# Patient Record
Sex: Male | Born: 1975
Health system: Southern US, Community
[De-identification: ages and names within clinical notes are randomized; demographics above are authoritative.]

## PROBLEM LIST (undated history)

## (undated) DIAGNOSIS — F329 Major depressive disorder, single episode, unspecified: Secondary | ICD-10-CM

## (undated) DIAGNOSIS — J309 Allergic rhinitis, unspecified: Secondary | ICD-10-CM

## (undated) DIAGNOSIS — T7840XA Allergy, unspecified, initial encounter: Secondary | ICD-10-CM

## (undated) DIAGNOSIS — B019 Varicella without complication: Secondary | ICD-10-CM

## (undated) DIAGNOSIS — F419 Anxiety disorder, unspecified: Secondary | ICD-10-CM

## (undated) DIAGNOSIS — F32A Depression, unspecified: Secondary | ICD-10-CM

## (undated) HISTORY — DX: Major depressive disorder, single episode, unspecified: F32.9

## (undated) HISTORY — PX: DENTAL SURGERY: SHX609

## (undated) HISTORY — DX: Varicella without complication: B01.9

## (undated) HISTORY — DX: Depression, unspecified: F32.A

## (undated) HISTORY — DX: Allergic rhinitis, unspecified: J30.9

## (undated) HISTORY — DX: Anxiety disorder, unspecified: F41.9

## (undated) HISTORY — DX: Allergy, unspecified, initial encounter: T78.40XA

---

## 2012-03-25 ENCOUNTER — Other Ambulatory Visit: Payer: Self-pay | Admitting: Family Medicine

## 2012-03-25 ENCOUNTER — Ambulatory Visit
Admission: RE | Admit: 2012-03-25 | Discharge: 2012-03-25 | Disposition: A | Discharge status: Home / Self Care | Payer: Private Health Insurance - Indemnity | Source: Ambulatory Visit | Attending: Family Medicine | Admitting: Family Medicine

## 2012-03-25 DIAGNOSIS — R300 Dysuria: Secondary | ICD-10-CM

## 2012-03-25 DIAGNOSIS — R109 Unspecified abdominal pain: Secondary | ICD-10-CM

## 2012-12-29 ENCOUNTER — Other Ambulatory Visit: Payer: Self-pay | Admitting: Allergy and Immunology

## 2012-12-29 ENCOUNTER — Ambulatory Visit
Admission: RE | Admit: 2012-12-29 | Discharge: 2012-12-29 | Disposition: A | Discharge status: Home / Self Care | Payer: Private Health Insurance - Indemnity | Source: Ambulatory Visit | Attending: Allergy and Immunology | Admitting: Allergy and Immunology

## 2012-12-29 DIAGNOSIS — R062 Wheezing: Secondary | ICD-10-CM

## 2015-09-19 ENCOUNTER — Encounter: Payer: Self-pay | Admitting: Adult Health

## 2015-09-19 ENCOUNTER — Ambulatory Visit (INDEPENDENT_AMBULATORY_CARE_PROVIDER_SITE_OTHER): Payer: Managed Care, Other (non HMO) | Admitting: Adult Health

## 2015-09-19 VITALS — BP 120/80 | HR 74 | Temp 98.3°F | Ht 74.5 in | Wt 234.0 lb

## 2015-09-19 DIAGNOSIS — Z0000 Encounter for general adult medical examination without abnormal findings: Secondary | ICD-10-CM | POA: Diagnosis not present

## 2015-09-19 DIAGNOSIS — Z23 Encounter for immunization: Secondary | ICD-10-CM

## 2015-09-19 LAB — CBC WITH DIFFERENTIAL/PLATELET
Basophils Absolute: 0 10*3/uL (ref 0.0–0.1)
Basophils Relative: 0.6 % (ref 0.0–3.0)
EOS PCT: 4.3 % (ref 0.0–5.0)
Eosinophils Absolute: 0.3 10*3/uL (ref 0.0–0.7)
HEMATOCRIT: 43.8 % (ref 39.0–52.0)
HEMOGLOBIN: 14.8 g/dL (ref 13.0–17.0)
LYMPHS ABS: 2.2 10*3/uL (ref 0.7–4.0)
LYMPHS PCT: 31.5 % (ref 12.0–46.0)
MCHC: 33.6 g/dL (ref 30.0–36.0)
MCV: 93.3 fl (ref 78.0–100.0)
MONOS PCT: 8.6 % (ref 3.0–12.0)
Monocytes Absolute: 0.6 10*3/uL (ref 0.1–1.0)
NEUTROS PCT: 55 % (ref 43.0–77.0)
Neutro Abs: 3.8 10*3/uL (ref 1.4–7.7)
Platelets: 381 10*3/uL (ref 150.0–400.0)
RBC: 4.7 Mil/uL (ref 4.22–5.81)
RDW: 12.8 % (ref 11.5–15.5)
WBC: 7 10*3/uL (ref 4.0–10.5)

## 2015-09-19 LAB — HEPATIC FUNCTION PANEL
ALBUMIN: 4.4 g/dL (ref 3.5–5.2)
ALK PHOS: 58 U/L (ref 39–117)
ALT: 39 U/L (ref 0–53)
AST: 23 U/L (ref 0–37)
Bilirubin, Direct: 0.1 mg/dL (ref 0.0–0.3)
TOTAL PROTEIN: 6.7 g/dL (ref 6.0–8.3)
Total Bilirubin: 0.6 mg/dL (ref 0.2–1.2)

## 2015-09-19 LAB — BASIC METABOLIC PANEL
BUN: 17 mg/dL (ref 6–23)
CHLORIDE: 105 meq/L (ref 96–112)
CO2: 30 meq/L (ref 19–32)
Calcium: 9.3 mg/dL (ref 8.4–10.5)
Creatinine, Ser: 1.25 mg/dL (ref 0.40–1.50)
GFR: 69.04 mL/min (ref >60.00)
GLUCOSE: 85 mg/dL (ref 70–99)
POTASSIUM: 3.9 meq/L (ref 3.5–5.1)
SODIUM: 142 meq/L (ref 135–145)

## 2015-09-19 LAB — LIPID PANEL
CHOL/HDL RATIO: 3
Cholesterol: 165 mg/dL (ref 0–200)
HDL: 47.8 mg/dL (ref >39.00)
LDL Cholesterol: 103 mg/dL — ABNORMAL HIGH (ref 0–99)
NONHDL: 116.77
Triglycerides: 71 mg/dL (ref 0.0–149.0)
VLDL: 14.2 mg/dL (ref 0.0–40.0)

## 2015-09-19 LAB — POCT URINALYSIS DIPSTICK
Bilirubin, UA: NEGATIVE
Glucose, UA: NEGATIVE
Ketones, UA: NEGATIVE
LEUKOCYTES UA: NEGATIVE
NITRITE UA: NEGATIVE
PH UA: 5.5
RBC UA: NEGATIVE
Spec Grav, UA: >=1.03
UROBILINOGEN UA: 0.2

## 2015-09-19 LAB — HEMOGLOBIN A1C: Hgb A1c MFr Bld: 5.1 % (ref 4.6–6.5)

## 2015-09-19 LAB — TSH: TSH: 2.12 u[IU]/mL (ref 0.35–4.50)

## 2015-09-19 NOTE — Patient Instructions (Addendum)
It was great meeting you today!  As discussed try OTC omeprazole for the upset stomach. If that does not work, please let me know.   I will follow up with you regarding labs.   You can follow up with me in one year or sooner if needed.   Health Maintenance, Male A healthy lifestyle and preventative care can promote health and wellness.  Maintain regular health, dental, and eye exams.  Eat a healthy diet. Foods like vegetables, fruits, whole grains, low-fat dairy products, and lean protein foods contain the nutrients you need and are low in calories. Decrease your intake of foods high in solid fats, added sugars, and salt. Get information about a proper diet from your health care provider, if necessary.  Regular physical exercise is one of the most important things you can do for your health. Most adults should get at least 150 minutes of moderate-intensity exercise (any activity that increases your heart rate and causes you to sweat) each week. In addition, most adults need muscle-strengthening exercises on 2 or more days a week.   Maintain a healthy weight. The body mass index (BMI) is a screening tool to identify possible weight problems. It provides an estimate of body fat based on height and weight. Your health care provider can find your BMI and can help you achieve or maintain a healthy weight. For males 20 years and older:  A BMI below 18.5 is considered underweight.  A BMI of 18.5 to 24.9 is normal.  A BMI of 25 to 29.9 is considered overweight.  A BMI of 30 and above is considered obese.  Maintain normal blood lipids and cholesterol by exercising and minimizing your intake of saturated fat. Eat a balanced diet with plenty of fruits and vegetables. Blood tests for lipids and cholesterol should begin at age 37 and be repeated every 5 years. If your lipid or cholesterol levels are high, you are over age 37, or you are at high risk for heart disease, you may need your cholesterol  levels checked more frequently.Ongoing high lipid and cholesterol levels should be treated with medicines if diet and exercise are not working.  If you smoke, find out from your health care provider how to quit. If you do not use tobacco, do not start.  Lung cancer screening is recommended for adults aged 55-80 years who are at high risk for developing lung cancer because of a history of smoking. A yearly low-dose CT scan of the lungs is recommended for people who have at least a 30-pack-year history of smoking and are current smokers or have quit within the past 15 years. A pack year of smoking is smoking an average of 1 pack of cigarettes a day for 1 year (for example, a 30-pack-year history of smoking could mean smoking 1 pack a day for 30 years or 2 packs a day for 15 years). Yearly screening should continue until the smoker has stopped smoking for at least 15 years. Yearly screening should be stopped for people who develop a health problem that would prevent them from having lung cancer treatment.  If you choose to drink alcohol, do not have more than 2 drinks per day. One drink is considered to be 12 oz (360 mL) of beer, 5 oz (150 mL) of wine, or 1.5 oz (45 mL) of liquor.  Avoid the use of street drugs. Do not share needles with anyone. Ask for help if you need support or instructions about stopping the use of drugs.  High blood pressure causes heart disease and increases the risk of stroke. High blood pressure is more likely to develop in:  People who have blood pressure in the end of the normal range (100-139/85-89 mm Hg).  People who are overweight or obese.  People who are African American.  If you are 58-22 years of age, have your blood pressure checked every 3-5 years. If you are 75 years of age or older, have your blood pressure checked every year. You should have your blood pressure measured twice--once when you are at a hospital or clinic, and once when you are not at a hospital or  clinic. Record the average of the two measurements. To check your blood pressure when you are not at a hospital or clinic, you can use:  An automated blood pressure machine at a pharmacy.  A home blood pressure monitor.  If you are 53-45 years old, ask your health care provider if you should take aspirin to prevent heart disease.  Diabetes screening involves taking a blood sample to check your fasting blood sugar level. This should be done once every 3 years after age 56 if you are at a normal weight and without risk factors for diabetes. Testing should be considered at a younger age or be carried out more frequently if you are overweight and have at least 1 risk factor for diabetes.  Colorectal cancer can be detected and often prevented. Most routine colorectal cancer screening begins at the age of 42 and continues through age 64. However, your health care provider may recommend screening at an earlier age if you have risk factors for colon cancer. On a yearly basis, your health care provider may provide home test kits to check for hidden blood in the stool. A small camera at the end of a tube may be used to directly examine the colon (sigmoidoscopy or colonoscopy) to detect the earliest forms of colorectal cancer. Talk to your health care provider about this at age 54 when routine screening begins. A direct exam of the colon should be repeated every 5-10 years through age 58, unless early forms of precancerous polyps or small growths are found.  People who are at an increased risk for hepatitis B should be screened for this virus. You are considered at high risk for hepatitis B if:  You were born in a country where hepatitis B occurs often. Talk with your health care provider about which countries are considered high risk.  Your parents were born in a high-risk country and you have not received a shot to protect against hepatitis B (hepatitis B vaccine).  You have HIV or AIDS.  You use needles  to inject street drugs.  You live with, or have sex with, someone who has hepatitis B.  You are a man who has sex with other men (MSM).  You get hemodialysis treatment.  You take certain medicines for conditions like cancer, organ transplantation, and autoimmune conditions.  Hepatitis C blood testing is recommended for all people born from [birth year] through 1963 and any individual with known risk factors for hepatitis C.  Healthy men should no longer receive prostate-specific antigen (PSA) blood tests as part of routine cancer screening. Talk to your health care provider about prostate cancer screening.  Testicular cancer screening is not recommended for adolescents or adult males who have no symptoms. Screening includes self-exam, a health care provider exam, and other screening tests. Consult with your health care provider about any symptoms you have or any  concerns you have about testicular cancer.  Practice safe sex. Use condoms and avoid high-risk sexual practices to reduce the spread of sexually transmitted infections (STIs).  You should be screened for STIs, including gonorrhea and chlamydia if:  You are sexually active and are younger than 24 years.  You are older than 24 years, and your health care provider tells you that you are at risk for this type of infection.  Your sexual activity has changed since you were last screened, and you are at an increased risk for chlamydia or gonorrhea. Ask your health care provider if you are at risk.  If you are at risk of being infected with HIV, it is recommended that you take a prescription medicine daily to prevent HIV infection. This is called pre-exposure prophylaxis (PrEP). You are considered at risk if:  You are a man who has sex with other men (MSM).  You are a heterosexual man who is sexually active with multiple partners.  You take drugs by injection.  You are sexually active with a partner who has HIV.  Talk with your health  care provider about whether you are at high risk of being infected with HIV. If you choose to begin PrEP, you should first be tested for HIV. You should then be tested every 3 months for as long as you are taking PrEP.  Use sunscreen. Apply sunscreen liberally and repeatedly throughout the day. You should seek shade when your shadow is shorter than you. Protect yourself by wearing long sleeves, pants, a wide-brimmed hat, and sunglasses year round whenever you are outdoors.  Tell your health care provider of new moles or changes in moles, especially if there is a change in shape or color. Also, tell your health care provider if a mole is larger than the size of a pencil eraser.  A one-time screening for abdominal aortic aneurysm (AAA) and surgical repair of large AAAs by ultrasound is recommended for men aged 41-75 years who are current or former smokers.  Stay current with your vaccines (immunizations).   This information is not intended to replace advice given to you by your health care provider. Make sure you discuss any questions you have with your health care provider.   Document Released: 03/27/2008 Document Revised: 10/20/2014 Document Reviewed: 02/24/2011 Elsevier Interactive Patient Education [employer].

## 2015-09-19 NOTE — Progress Notes (Signed)
HPI:  Aaron [name] is here to establish care and for his complete physical. He is a healthy caucasian male who  has a past medical history of Chicken pox; Depression; and Allergy.   Last PCP and physical: Three years ago  Immunizations: UTD  Diet: Twice a day, tries to eat healthy Exercise: walks at work, does not exercise outside of work.  Is followed by [facility] for depression.    Has the following chronic problems that require follow up and concerns today:  Depression  - Controlled on current medication. He sees his therapist twice a month   ROS negative for unless reported above: fevers, chills,feeling poorly, unintentional weight loss, hearing or vision loss, chest pain, palpitations, leg claudication, struggling to breath,Not feeling congested in the chest, no orthopenia, no cough,no wheezing, normal appetite, no soft tissue swelling, no hemoptysis, melena, hematochezia, hematuria, falls, loc, si, or thoughts of self harm.    Past Medical History  Diagnosis Date  . Chicken pox   . Depression   . Allergy     History reviewed. No pertinent past surgical history.  Family History  Problem Relation Age of Onset  . Heart disease Father     Social History   Social History  . Marital Status: Single    Spouse Name: N/A  . Number of Children: N/A  . Years of Education: N/A   Social History Main Topics  . Smoking status: Never Smoker   . Smokeless tobacco: None  . Alcohol Use: Yes  . Drug Use: None  . Sexual Activity: Not Asked   Other Topics Concern  . None   Social History Narrative  . None     Current outpatient prescriptions:  .  buPROPion (WELLBUTRIN XL) 150 MG 24 hr tablet, Take 150 mg by mouth daily., Disp: , Rfl: 0 .  clonazePAM (KLONOPIN) 0.5 MG tablet, Take 0.5 mg by mouth daily., Disp: , Rfl: 0 .  DULoxetine (CYMBALTA) 60 MG capsule, Take 60 mg by mouth daily., Disp: , Rfl: 0 .  REXULTI 1 MG TABS, Take 1 mg by mouth daily.,  Disp: , Rfl:   EXAM:  Filed Vitals:   09/19/15 1308  BP: 120/80  Pulse: 74  Temp: 98.3 F (36.8 C)    Body mass index is 29.65 kg/(m^2).  GENERAL: vitals reviewed and listed above, alert, oriented, appears well hydrated and in no acute distress  HEENT: atraumatic, conjunttiva clear, no obvious abnormalities on inspection of external nose and ears  NECK: Neck is soft and supple without masses, no adenopathy or thyromegaly, trachea midline, no JVD. Normal range of motion.   LUNGS: clear to auscultation bilaterally, no wheezes, rales or rhonchi, good air movement  CV: Regular rate and rhythm, normal S1/S2, no audible murmurs, gallops, or rubs. No carotid bruit and no peripheral edema.   MS: moves all extremities without noticeable abnormality. No edema noted  Abd: soft/nontender/nondistended/normal bowel sounds   Skin: warm and dry, no rash   Extremities: No clubbing, cyanosis, or edema. Capillary refill is WNL. Pulses intact bilaterally in upper and lower extremities.   Neuro: CN II-XII intact, sensation and reflexes normal throughout, 5/5 muscle strength in bilateral upper and lower extremities. Normal finger to nose. Normal rapid alternating movements. Normal romberg. No pronator drift.   PSYCH: pleasant and cooperative, no obvious depression or anxiety  ASSESSMENT AND PLAN:  1. Routine general medical examination at a health care facility - Basic metabolic panel - CBC with Differential/Platelet - Hemoglobin A1c -  Hepatic function panel - Lipid panel - POCT urinalysis dipstick - TSH - HIV antibody - HSV(herpes smplx)abs-1+2(IgG+IgM)-bld - RPR - Acute Hep Panel & Hep B Surface Ab - Work on diet and exercise.  - Follow up in one year or sooner if needed.   Discussed the following assessment and plan:  -We reviewed the PMH, PSH, FH, SH, Meds and Allergies. -We provided refills for any medications we will prescribe as needed. -We addressed current concerns per  orders and patient instructions. -We have asked for records for pertinent exams, studies, vaccines and notes from previous providers. -We have advised patient to follow up per instructions below.   -Patient advised to return or notify a provider immediately if symptoms worsen or persist or new concerns arise.    Shirline Freesory Susana Duell , AGNP

## 2015-09-19 NOTE — Progress Notes (Signed)
Pre visit review using our clinic review tool, if applicable. No additional management support is needed unless otherwise documented below in the visit note. 

## 2015-09-19 NOTE — Addendum Note (Signed)
Addended by: Kern ReapVEREEN, Herma Uballe B on: 09/19/2015 01:44 PM   Modules accepted: Orders

## 2015-09-20 LAB — ACUTE HEP PANEL AND HEP B SURFACE AB
HCV AB: NEGATIVE
HEP A IGM: NONREACTIVE
HEP B S AB: NEGATIVE
HEP B S AG: NEGATIVE
Hep B C IgM: NONREACTIVE

## 2015-09-20 LAB — RPR

## 2015-09-20 LAB — HIV ANTIBODY (ROUTINE TESTING W REFLEX): HIV 1&2 Ab, 4th Generation: NONREACTIVE

## 2015-09-21 LAB — HSV(HERPES SMPLX)ABS-I+II(IGG+IGM)-BLD
HSV 1 Glycoprotein G Ab, IgG: 8.89 IV — ABNORMAL HIGH
HSV 2 Glycoprotein G Ab, IgG: <0.1 IV
Herpes Simplex Vrs I&II-IgM Ab (EIA): 0.83 INDEX

## 2015-09-27 ENCOUNTER — Encounter: Payer: Self-pay | Admitting: Adult Health

## 2015-10-10 ENCOUNTER — Ambulatory Visit: Payer: Private Health Insurance - Indemnity | Admitting: Adult Health

## 2016-10-16 ENCOUNTER — Ambulatory Visit (INDEPENDENT_AMBULATORY_CARE_PROVIDER_SITE_OTHER): Payer: Commercial Managed Care - PPO | Admitting: Adult Health

## 2016-10-16 ENCOUNTER — Encounter: Payer: Self-pay | Admitting: Adult Health

## 2016-10-16 ENCOUNTER — Encounter: Payer: Managed Care, Other (non HMO) | Admitting: Adult Health

## 2016-10-16 VITALS — BP 154/80 | Temp 98.6°F | Ht 74.5 in | Wt 235.9 lb

## 2016-10-16 DIAGNOSIS — M25562 Pain in left knee: Secondary | ICD-10-CM | POA: Diagnosis not present

## 2016-10-16 DIAGNOSIS — G8929 Other chronic pain: Secondary | ICD-10-CM

## 2016-10-16 DIAGNOSIS — Z0000 Encounter for general adult medical examination without abnormal findings: Secondary | ICD-10-CM

## 2016-10-16 DIAGNOSIS — M25561 Pain in right knee: Secondary | ICD-10-CM

## 2016-10-16 NOTE — Progress Notes (Addendum)
Subjective:    Patient ID: Aaron GrieveDamian Arrey, male    DOB: 1976-04-17, 41 y.o.   MRN: [id number]  HPI  Patient presents for yearly preventative medicine examination. He is a pleasant 41 year old male who  has a past medical history of Allergy; Chicken pox; and Depression.  All immunizations and health maintenance protocols were reviewed with the patient and needed orders were placed.  Appropriate screening laboratory values were ordered for the patient including screening of hyperlipidemia, renal function and hepatic function.  extrapyramidal reactio  Medication reconciliation,  past medical history, social history, problem list and allergies were reviewed in detail with the patient  Goals were established with regard to weight loss, exercise, and  diet in compliance with medications. He is not exercising but is eating healthy. He recently was switched from 3rd shift to 1st shift.   He denies any interval history but is happy to report that he welcomed a new baby boy into his family.   He continues to follow up with psychiatry bi monthly. His medications have been switched to Valium when needed and Saphris daily. He feels as though he is doing well on this medication but that there is an adjustment period to it. He does feel anxious from time to time.   He continues to have episodes of bilateral knee pain. This has been a chronic issue. Not currently doing any therapy.    Review of Systems  Constitutional: Negative.   HENT: Negative.   Eyes: Negative.   Respiratory: Negative.   Cardiovascular: Negative.   Gastrointestinal: Negative.   Endocrine: Negative.   Genitourinary: Negative.   Musculoskeletal: Positive for arthralgias and back pain. Negative for gait problem and myalgias.  Skin: Negative.   Allergic/Immunologic: Negative.   Neurological: Negative.   Hematological: Negative.   Psychiatric/Behavioral: Positive for sleep disturbance. The patient is nervous/anxious.   All  other systems reviewed and are negative.  Past Medical History:  Diagnosis Date  . Allergy   . Chicken pox   . Depression     Social History   Social History  . Marital status: Single    Spouse name: N/A  . Number of children: N/A  . Years of education: N/A   Occupational History  . Not on file.   Social History Main Topics  . Smoking status: Never Smoker  . Smokeless tobacco: Not on file  . Alcohol use 0.0 oz/week     Comment: occassional  . Drug use: No  . Sexual activity: Not on file   Other Topics Concern  . 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    Married    Two daughters, one daughter on the way    He likes to sleep     No past surgical history on file.  Family History  Problem Relation Age of Onset  . Heart disease Father   . Depression Maternal Grandmother     commited suicide    No Known Allergies  Current Outpatient Prescriptions on File Prior to Visit  Medication Sig Dispense Refill  . DULoxetine (CYMBALTA) 60 MG capsule Take 60 mg by mouth daily.  0   No current facility-administered medications on file prior to visit.     BP (!) 154/80   Temp 98.6 F (37 C) (Oral)   Ht 6' 2.5" (1.892 m)   Wt 235 lb 14.4 oz (107 kg)   BMI 29.88 kg/m  Objective:   Physical Exam  Constitutional: He is oriented to person, place, and time. Vital signs are normal. He appears well-developed and well-nourished. No distress.  HENT:  Head: Normocephalic and atraumatic.  Right Ear: External ear normal.  Left Ear: External ear normal.  Nose: Nose normal.  Mouth/Throat: Oropharynx is clear and moist. No oropharyngeal exudate.  Eyes: Conjunctivae and EOM are normal. Pupils are equal, round, and reactive to light. Right eye exhibits no discharge. Left eye exhibits no discharge. No scleral icterus.  Neck: Trachea normal and normal range of motion. Neck supple. No JVD present. No tracheal  tenderness present. Carotid bruit is not present. No tracheal deviation present. No thyroid mass and no thyromegaly present.  Cardiovascular: Normal rate, regular rhythm, normal heart sounds and intact distal pulses.  Exam reveals no gallop and no friction rub.   No murmur heard. Pulmonary/Chest: Effort normal and breath sounds normal. No stridor. No respiratory distress. He has no wheezes. He has no rales. He exhibits no tenderness.  Abdominal: Soft. Bowel sounds are normal. He exhibits no distension and no mass. There is no tenderness. There is no rebound and no guarding.  Genitourinary:  Genitourinary Comments: deferred  Musculoskeletal: Normal range of motion. He exhibits no edema, tenderness or deformity.  Lymphadenopathy:    He has no cervical adenopathy.  Neurological: He is alert and oriented to person, place, and time. He has normal reflexes. He displays normal reflexes. No cranial nerve deficit. He exhibits normal muscle tone. Coordination normal.  Skin: Skin is warm and dry. No rash noted. He is not diaphoretic. No erythema. No pallor.  Psychiatric: He has a normal mood and affect. His behavior is normal. Judgment and thought content normal.  Nursing note and vitals reviewed.     Assessment & Plan:  1. Routine general medical examination at a health care facility - Educated on the importance of diet and exercise.  - Basic metabolic panel; Future - CBC with Differential/Platelet; Future - Hepatic function panel; Future - Lipid panel; Future - POCT Urinalysis Dipstick (Automated); Future - TSH; Future - Follow up in one year or sooner if needed  2. Chronic pain of both knees - Likely arthritic pain. No concern for ligament or tendon tear.  - Nsaids as needed - Consider referral to PT in the future; if needed   Shirline Frees, NP

## 2016-10-22 ENCOUNTER — Encounter: Payer: Self-pay | Admitting: Adult Health

## 2016-10-24 ENCOUNTER — Encounter: Payer: Self-pay | Admitting: Adult Health

## 2016-10-24 ENCOUNTER — Ambulatory Visit (INDEPENDENT_AMBULATORY_CARE_PROVIDER_SITE_OTHER): Payer: Commercial Managed Care - PPO | Admitting: Adult Health

## 2016-10-24 VITALS — BP 148/78 | Temp 98.3°F

## 2016-10-24 DIAGNOSIS — K2100 Gastro-esophageal reflux disease with esophagitis, without bleeding: Secondary | ICD-10-CM

## 2016-10-24 NOTE — Progress Notes (Signed)
Subjective:    Patient ID: Aaron [name], male    DOB: 29-Dec-1975, 41 y.o.   MRN: [id number]  HPI  41 year old male who  has a past medical history of Allergy; Chicken pox; and Depression. He presents to the office today to discuss medical records dealing with arthritis in bilateral knees and lower back. She reports that he is going to the [facility] since he is a [facility] to be evaluated for this. He just needs notes from me to give to the [facility]  He also feels as though some of his psychiatry medicines might be causing acid reflux. He does have occasional burning and waking up with a sour taste in his mouth. He states that his psychiatrist diagnosed him with GERD and he is wondering what he can do about it.  Review of Systems  Constitutional: Negative.   Respiratory: Negative.   Cardiovascular: Negative.   Gastrointestinal: Positive for abdominal pain. Negative for abdominal distention, anal bleeding, blood in stool, constipation, diarrhea, nausea, rectal pain and vomiting.  Musculoskeletal: Positive for arthralgias and back pain.  Neurological: Negative.   All other systems reviewed and are negative.  Past Medical History:  Diagnosis Date  . Allergy   . Chicken pox   . Depression     Social History   Social History  . Marital status: Single    Spouse name: N/A  . Number of children: N/A  . Years of education: N/A   Occupational History  . Not on file.   Social History Main Topics  . Smoking status: Never Smoker  . Smokeless tobacco: Not on file  . Alcohol use 0.0 oz/week     Comment: occassional  . Drug use: No  . Sexual activity: Not on file   Other Topics Concern  . 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    Married    Two daughters, one daughter on the way    He likes to sleep     No past surgical history on file.  Family History  Problem Relation Age of Onset  . Heart disease Father   . Depression  Maternal Grandmother     commited suicide    No Known Allergies  Current Outpatient Prescriptions on File Prior to Visit  Medication Sig Dispense Refill  . Asenapine Maleate (SAPHRIS) 10 MG SUBL Place under the tongue.    . diazepam (VALIUM) 5 MG tablet Take 5 mg by mouth every 6 (six) hours as needed for anxiety.    . DULoxetine (CYMBALTA) 60 MG capsule Take 60 mg by mouth daily.  0   No current facility-administered medications on file prior to visit.     BP (!) 148/78   Temp 98.3 F (36.8 C) (Oral)       Objective:   Physical Exam  Constitutional: He is oriented to person, place, and time. He appears well-developed and well-nourished. No distress.  Cardiovascular: Normal rate, regular rhythm, normal heart sounds and intact distal pulses.  Exam reveals no gallop and no friction rub.   No murmur heard. Pulmonary/Chest: Effort normal and breath sounds normal. No respiratory distress. He has no wheezes. He has no rales. He exhibits no tenderness.  Abdominal: Soft. Bowel sounds are normal. He exhibits no distension and no mass. There is no tenderness. There is no rebound and no guarding.  Musculoskeletal: Normal range of motion. He exhibits no edema, tenderness or deformity.  Neurological: He is  alert and oriented to person, place, and time.  Skin: Skin is warm and dry. No rash noted. He is not diaphoretic. No erythema. No pallor.  Psychiatric: He has a normal mood and affect. His behavior is normal. Judgment and thought content normal.  Nursing note and vitals reviewed.     Assessment & Plan:  1. Gastroesophageal reflux disease with esophagitis - Advised to work on changing diet trying to avoid fried foods, fatty foods, acidic foods, spicy foods. - Can also try taking over-the-counter omeprazole 20 mg daily to see if this helps - Follow up if no improvement  * Records given to the patient dealing with his chronic arthritis pain  Shirline Frees, NP

## 2016-10-29 ENCOUNTER — Other Ambulatory Visit: Payer: Commercial Managed Care - PPO

## 2016-11-14 ENCOUNTER — Other Ambulatory Visit (INDEPENDENT_AMBULATORY_CARE_PROVIDER_SITE_OTHER): Payer: Commercial Managed Care - PPO

## 2016-11-14 DIAGNOSIS — Z0000 Encounter for general adult medical examination without abnormal findings: Secondary | ICD-10-CM | POA: Diagnosis not present

## 2016-11-14 LAB — BASIC METABOLIC PANEL
BUN: 13 mg/dL (ref 6–23)
CALCIUM: 9.8 mg/dL (ref 8.4–10.5)
CO2: 32 mEq/L (ref 19–32)
CREATININE: 1.13 mg/dL (ref 0.40–1.50)
Chloride: 102 mEq/L (ref 96–112)
GFR: 77.08 mL/min (ref >60.00)
GLUCOSE: 92 mg/dL (ref 70–99)
POTASSIUM: 4.1 meq/L (ref 3.5–5.1)
Sodium: 141 mEq/L (ref 135–145)

## 2016-11-14 LAB — HEPATIC FUNCTION PANEL
ALT: 33 U/L (ref 0–53)
AST: 25 U/L (ref 0–37)
Albumin: 4.7 g/dL (ref 3.5–5.2)
Alkaline Phosphatase: 69 U/L (ref 39–117)
BILIRUBIN DIRECT: 0.1 mg/dL (ref 0.0–0.3)
BILIRUBIN TOTAL: 0.4 mg/dL (ref 0.2–1.2)
Total Protein: 7.2 g/dL (ref 6.0–8.3)

## 2016-11-14 LAB — CBC WITH DIFFERENTIAL/PLATELET
BASOS ABS: 0 10*3/uL (ref 0.0–0.1)
Basophils Relative: 0.7 % (ref 0.0–3.0)
EOS ABS: 0.5 10*3/uL (ref 0.0–0.7)
EOS PCT: 6.9 % — AB (ref 0.0–5.0)
HCT: 42.5 % (ref 39.0–52.0)
HEMOGLOBIN: 14.9 g/dL (ref 13.0–17.0)
LYMPHS ABS: 1.3 10*3/uL (ref 0.7–4.0)
Lymphocytes Relative: 19.6 % (ref 12.0–46.0)
MCHC: 35.1 g/dL (ref 30.0–36.0)
MCV: 93.1 fl (ref 78.0–100.0)
MONO ABS: 0.6 10*3/uL (ref 0.1–1.0)
Monocytes Relative: 9 % (ref 3.0–12.0)
NEUTROS PCT: 63.8 % (ref 43.0–77.0)
Neutro Abs: 4.3 10*3/uL (ref 1.4–7.7)
Platelets: 352 10*3/uL (ref 150.0–400.0)
RBC: 4.56 Mil/uL (ref 4.22–5.81)
RDW: 13.2 % (ref 11.5–15.5)
WBC: 6.7 10*3/uL (ref 4.0–10.5)

## 2016-11-14 LAB — LIPID PANEL
CHOLESTEROL: 182 mg/dL (ref 0–200)
HDL: 69.3 mg/dL (ref >39.00)
LDL Cholesterol: 96 mg/dL (ref 0–99)
NonHDL: 112.91
Total CHOL/HDL Ratio: 3
Triglycerides: 86 mg/dL (ref 0.0–149.0)
VLDL: 17.2 mg/dL (ref 0.0–40.0)

## 2016-11-14 LAB — TSH: TSH: 1.33 u[IU]/mL (ref 0.35–4.50)

## 2016-12-10 ENCOUNTER — Ambulatory Visit (INDEPENDENT_AMBULATORY_CARE_PROVIDER_SITE_OTHER): Payer: Commercial Managed Care - PPO | Admitting: Adult Health

## 2016-12-10 ENCOUNTER — Ambulatory Visit (INDEPENDENT_AMBULATORY_CARE_PROVIDER_SITE_OTHER)
Admission: RE | Admit: 2016-12-10 | Discharge: 2016-12-10 | Disposition: A | Discharge status: Home / Self Care | Payer: Commercial Managed Care - PPO | Source: Ambulatory Visit | Attending: Adult Health | Admitting: Adult Health

## 2016-12-10 VITALS — BP 144/80 | Temp 98.2°F | Ht 74.5 in | Wt 244.8 lb

## 2016-12-10 DIAGNOSIS — S3992XA Unspecified injury of lower back, initial encounter: Secondary | ICD-10-CM

## 2016-12-10 MED ORDER — METHYLPREDNISOLONE ACETATE 80 MG/ML IJ SUSP
80.0000 mg | Freq: Once | INTRAMUSCULAR | Status: AC
  Administered 2016-12-10: 80 mg via INTRAMUSCULAR

## 2016-12-10 NOTE — Progress Notes (Signed)
Subjective:    Patient ID: Aaron [name], male    DOB: 07-01-1976, 41 y.o.   MRN: [id number]  HPI  41 year old male who presents to the clinic today for an acute complaint of low back pain that started yesterday. He reports that he was working on his car when he felt a " pop" in his lower back. Soon after he had pain in his central/left lower back with radiating pain up to mid back. Pain is worse with certain movements such as bending and twisting.   He has been using Motrin and a heating pad without much relief.   He is taking Valium daily - BID for anxiety  Denies an loss of bowel or bladder. Has no numbness or tingling in lower extremities.   Review of Systems Negative unless mentioned above in HPI   Past Medical History:  Diagnosis Date  . Allergy   . Chicken pox   . Depression     Social History   Social History  . Marital status: Single    Spouse name: N/A  . Number of children: N/A  . Years of education: N/A   Occupational History  . Not on file.   Social History Main Topics  . Smoking status: Never Smoker  . Smokeless tobacco: Not on file  . Alcohol use 0.0 oz/week     Comment: occassional  . Drug use: No  . Sexual activity: Not on file   Other Topics Concern  . 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    Married    Two daughters, one daughter on the way    He likes to sleep     No past surgical history on file.  Family History  Problem Relation Age of Onset  . Heart disease Father   . Depression Maternal Grandmother     commited suicide    No Known Allergies  Current Outpatient Prescriptions on File Prior to Visit  Medication Sig Dispense Refill  . Asenapine Maleate (SAPHRIS) 10 MG SUBL Place 5 mg under the tongue. Patient taking 5mg  daily.    . diazepam (VALIUM) 5 MG tablet Take 5 mg by mouth every 6 (six) hours as needed for anxiety.    . DULoxetine (CYMBALTA) 60 MG capsule  Take 60 mg by mouth daily.  0   No current facility-administered medications on file prior to visit.     BP (!) 144/80   Temp 98.2 F (36.8 C) (Oral)   Ht 6' 2.5" (1.892 m)   Wt 244 lb 12.8 oz (111 kg)   BMI 31.01 kg/m       Objective:   Physical Exam  Constitutional: He is oriented to person, place, and time. He appears well-developed and well-nourished. No distress.  Cardiovascular: Normal rate, regular rhythm, normal heart sounds and intact distal pulses.  Exam reveals no gallop and no friction rub.   No murmur heard. Pulmonary/Chest: Effort normal and breath sounds normal. No respiratory distress. He has no wheezes. He has no rales. He exhibits no tenderness.  Musculoskeletal: He exhibits tenderness. He exhibits no edema or deformity.  Tenderness with palpation to lower lumbar. Pain with palpation to left lower back. No bruising noted.   Neurological: He is alert and oriented to person, place, and time.  Skin: Skin is warm and dry. No rash noted. He is not diaphoretic. No erythema. No pallor.  Psychiatric: He has a normal mood and affect.  His behavior is normal. Judgment and thought content normal.  Nursing note and vitals reviewed.     Assessment & Plan:  1. Injury of low back, initial encounter - Likely strain of lower back. Will give shot of Depo Medrol in the office. He can take Valium for muscle relaxer. Will get X ray due to "popping sensation" to r/o herniated disk.  - DG Lumbar Spine Complete; Future - methylPREDNISolone acetate (DEPO-MEDROL) injection 80 mg; Inject 1 mL (80 mg total) into the muscle once. - Continue with motrin and heating pad  - Follow up if no improvement  Shirline Frees, NP

## 2016-12-12 ENCOUNTER — Other Ambulatory Visit: Payer: Commercial Managed Care - PPO

## 2017-02-17 ENCOUNTER — Encounter: Payer: Self-pay | Admitting: Adult Health

## 2017-02-17 ENCOUNTER — Ambulatory Visit (INDEPENDENT_AMBULATORY_CARE_PROVIDER_SITE_OTHER): Payer: Commercial Managed Care - PPO | Admitting: Adult Health

## 2017-02-17 VITALS — BP 138/74 | Temp 98.0°F | Ht 74.5 in | Wt 251.2 lb

## 2017-02-17 DIAGNOSIS — J029 Acute pharyngitis, unspecified: Secondary | ICD-10-CM

## 2017-02-17 LAB — POCT RAPID STREP A (OFFICE): Rapid Strep A Screen: NEGATIVE

## 2017-02-17 MED ORDER — MAGIC MOUTHWASH W/LIDOCAINE: 5.0000 mL | Freq: Three times a day (TID) | ORAL | 0 refills | Status: DC | PRN

## 2017-02-17 MED ORDER — PREDNISONE 20 MG PO TABS: 20.0000 mg | ORAL_TABLET | Freq: Every day | ORAL | 0 refills | Status: DC

## 2017-02-17 NOTE — Progress Notes (Signed)
   Subjective:    Patient ID: Aaron [name], male    DOB: Sep 28, 1976, 41 y.o.   MRN: [id number]  Sore Throat   This is a new problem. The current episode started in the past 7 days. Neither side of throat is experiencing more pain than the other. There has been no fever. Associated symptoms include coughing, shortness of breath and trouble swallowing. Pertinent negatives include no ear pain. He has had no exposure to strep or mono. He has tried nothing for the symptoms.      Review of Systems  Constitutional: Negative.   HENT: Positive for sore throat and trouble swallowing. Negative for ear pain, postnasal drip, rhinorrhea, sinus pain, sinus pressure and voice change.   Eyes: Negative.   Respiratory: Positive for cough and shortness of breath.   Cardiovascular: Negative.   Gastrointestinal: Negative.   All other systems reviewed and are negative.      Objective:   Physical Exam  Constitutional: He is oriented to person, place, and time. He appears well-developed and well-nourished.  HENT:  Head: Normocephalic and atraumatic.  Right Ear: Hearing, tympanic membrane, external ear and ear canal normal. No decreased hearing is noted.  Left Ear: Hearing, tympanic membrane, external ear and ear canal normal. No decreased hearing is noted.  Nose: Nose normal. Right sinus exhibits no maxillary sinus tenderness and no frontal sinus tenderness. Left sinus exhibits no maxillary sinus tenderness and no frontal sinus tenderness.  Mouth/Throat: Uvula is midline and mucous membranes are normal. Posterior oropharyngeal erythema present. No oropharyngeal exudate, posterior oropharyngeal edema or tonsillar abscesses.  Eyes: Conjunctivae and EOM are normal. Pupils are equal, round, and reactive to light. Right eye exhibits no discharge. Left eye exhibits no discharge. No scleral icterus.  Neck: Normal range of motion. Neck supple.  Cardiovascular: Normal rate, regular rhythm, normal heart sounds and  intact distal pulses.  Exam reveals no gallop.   No murmur heard. Pulmonary/Chest: Effort normal and breath sounds normal. No respiratory distress. He has no wheezes. He has no rales. He exhibits no tenderness.  Lymphadenopathy:    He has cervical adenopathy.  Neurological: He is alert and oriented to person, place, and time.  Skin: Skin is warm and dry. No rash noted. He is not diaphoretic. No erythema.  Psychiatric: He has a normal mood and affect. His behavior is normal. Judgment and thought content normal.  Vitals reviewed.     Assessment & Plan:  1. Sore throat - Throat mildly red on exam. No signs of strep, no thrush. Likely viral in etiology  - POC Rapid Strep A- Negative  - predniSONE (DELTASONE) 20 MG tablet; Take 1 tablet (20 mg total) by mouth daily with breakfast.  Dispense: 5 tablet; Refill: 0 - magic mouthwash w/lidocaine SOLN; Take 5 mLs by mouth 3 (three) times daily as needed. Gargle and spit  Dispense: 180 mL; Refill: 0 - Nsaids for symptom relief.  - Follow up if no improvement in the next 2-3 days   Shirline Freesory Baraka Klatt, AGNP

## 2017-03-10 ENCOUNTER — Ambulatory Visit (INDEPENDENT_AMBULATORY_CARE_PROVIDER_SITE_OTHER): Payer: Commercial Managed Care - PPO | Admitting: Family Medicine

## 2017-03-10 ENCOUNTER — Encounter: Payer: Self-pay | Admitting: Family Medicine

## 2017-03-10 VITALS — BP 134/78 | HR 77 | Temp 98.5°F | Ht 74.5 in | Wt 249.0 lb

## 2017-03-10 DIAGNOSIS — J329 Chronic sinusitis, unspecified: Secondary | ICD-10-CM | POA: Diagnosis not present

## 2017-03-10 DIAGNOSIS — H66001 Acute suppurative otitis media without spontaneous rupture of ear drum, right ear: Secondary | ICD-10-CM | POA: Diagnosis not present

## 2017-03-10 DIAGNOSIS — B9689 Other specified bacterial agents as the cause of diseases classified elsewhere: Secondary | ICD-10-CM

## 2017-03-10 DIAGNOSIS — Z23 Encounter for immunization: Secondary | ICD-10-CM | POA: Diagnosis not present

## 2017-03-10 MED ORDER — AMOXICILLIN-POT CLAVULANATE 875-125 MG PO TABS: 1.0000 | ORAL_TABLET | Freq: Two times a day (BID) | ORAL | 0 refills | Status: DC

## 2017-03-10 NOTE — Patient Instructions (Signed)
Sinsusitis and right otitis media Bacterial based on: Symptoms >10 days, double sickening. Also has developed right sided ear infection  Treatment: -considered steroid: we opted out as recently treated with this -other symptomatic care with mucinex -Antibiotic indicated: yes  Finally, we reviewed reasons to return to care including if symptoms worsen or persist or new concerns arise (particularly fever or shortness of breath)  Meds ordered this encounter  Medications  . amoxicillin-clavulanate (AUGMENTIN) 875-125 MG tablet    Sig: Take 1 tablet by mouth 2 (two) times daily.    Dispense:  14 tablet    Refill:  0

## 2017-03-10 NOTE — Progress Notes (Addendum)
PCP: Shirline FreesNafziger, Cory, NP  Subjective:  Aaron [name] is a 41 y.o. year old very pleasant male patient who presents with sinusitis symptoms including nasal congestion with brown discharge in AM clearing to yellow by afternoon, sinus tenderness -other symptoms include: mild sore throat. Was seen 2 weeks ago for more significant sore throat and strep was negative rapid. Mild cough as well.  Progressive ear pain over last few days- woke up with right ear throbbing this AM -day of illness:at least 3 weeks -Symptoms are worsening after improving on prednisone initially -previous treatments: prednisone, magic mouthwash, sudafed  ROS-denies fever, SOB, NVD, tooth pain  Pertinent Past Medical History- anxiety  Medications- reviewed  Current Outpatient Prescriptions  Medication Sig Dispense Refill  . Asenapine Maleate (SAPHRIS) 10 MG SUBL Place 5 mg under the tongue. Patient taking 5mg  daily.    . diazepam (VALIUM) 5 MG tablet Take 5 mg by mouth every 6 (six) hours as needed for anxiety.    . DULoxetine (CYMBALTA) 60 MG capsule Take 60 mg by mouth daily.  0   No current facility-administered medications for this visit.     Objective: BP 134/78 (BP Location: Left Arm, Patient Position: Sitting, Cuff Size: Large)   Pulse 77   Temp 98.5 F (36.9 C) (Oral)   Ht 6' 2.5" (1.892 m)   Wt 249 lb (112.9 kg)   SpO2 97%   BMI 31.54 kg/m  Gen: NAD, resting comfortably HEENT: Turbinates erythematous with yellow drainage, TM normal on left ear other than mild erythema on right has pronounced erythema and bulging membrane, pharynx mildly erythematous with no tonsilar exudate or edema, maxillary sinus tenderness CV: RRR no murmurs rubs or gallops Lungs: CTAB no crackles, wheeze, rhonchi Ext: no edema Skin: warm, dry, no rash Neuro: grossly normal, moves all extremities  Assessment/Plan:  Sinsusitis and right otitis media Bacterial based on: Symptoms >10 days, double sickening. Also has developed  right sided ear infection  Treatment: -considered steroid: we opted out as recently treated with this -other symptomatic care with mucinex -Antibiotic indicated: yes  Finally, we reviewed reasons to return to care including if symptoms worsen or persist or new concerns arise (particularly fever or shortness of breath)  Meds ordered this encounter  Medications  . amoxicillin-clavulanate (AUGMENTIN) 875-125 MG tablet    Sig: Take 1 tablet by mouth 2 (two) times daily.    Dispense:  14 tablet    Refill:  0    Tana ConchStephen Cythina Mickelsen, MD

## 2017-03-10 NOTE — Addendum Note (Signed)
Addended by: Rivka SaferSOUTHERN HIZER, Asher MuirJAMIE M on: 03/10/2017 11:52 AM   Modules accepted: Orders

## 2017-03-26 ENCOUNTER — Telehealth: Payer: Self-pay | Admitting: *Deleted

## 2017-03-26 NOTE — Telephone Encounter (Signed)
Patient's wife called in to report that they received a bill for immunization that patient didn't receive; investigated patient's office visit from 03/10/17; discovered that patient's chart had incorrect documentation; advised patient's wife of the error and that we would contact charge correction for billing error and ensure to correct documentation in patient's chart. Understanding voiced. Will follow up with wife once corrections complete.

## 2017-04-03 NOTE — Telephone Encounter (Signed)
Per charge correction; errors in billing have been corrected; they are sending documentation to patient's insurance [facility]); notified patient of completion of charge correction.

## 2017-05-19 ENCOUNTER — Ambulatory Visit: Payer: Commercial Managed Care - PPO

## 2017-07-03 ENCOUNTER — Encounter: Payer: Self-pay | Admitting: Adult Health

## 2017-09-30 ENCOUNTER — Encounter: Payer: Self-pay | Admitting: Adult Health

## 2017-09-30 ENCOUNTER — Ambulatory Visit (INDEPENDENT_AMBULATORY_CARE_PROVIDER_SITE_OTHER): Payer: Commercial Managed Care - PPO | Admitting: Adult Health

## 2017-09-30 VITALS — BP 128/80 | Temp 98.5°F | Wt 241.0 lb

## 2017-09-30 DIAGNOSIS — B9689 Other specified bacterial agents as the cause of diseases classified elsewhere: Secondary | ICD-10-CM | POA: Diagnosis not present

## 2017-09-30 DIAGNOSIS — J329 Chronic sinusitis, unspecified: Secondary | ICD-10-CM | POA: Diagnosis not present

## 2017-09-30 MED ORDER — AMOXICILLIN-POT CLAVULANATE 875-125 MG PO TABS: 1.0000 | ORAL_TABLET | Freq: Two times a day (BID) | ORAL | 0 refills | Status: AC

## 2017-09-30 MED ORDER — FLUTICASONE PROPIONATE 50 MCG/ACT NA SUSP: 2.0000 | Freq: Every day | NASAL | 6 refills | Status: DC

## 2017-09-30 NOTE — Progress Notes (Signed)
Subjective:    Patient ID: Aaron [name], male    DOB: [birth year], 41 y.o.   MRN: [id number]  Sinusitis  This is a new problem. The current episode started 1 to 4 weeks ago (10 days ). The problem has been gradually worsening since onset. There has been no fever. Associated symptoms include congestion, coughing (semi productive ), diaphoresis, ear pain, headaches, sinus pressure and a sore throat. Pertinent negatives include no chills or shortness of breath. Past treatments include acetaminophen and oral decongestants. The treatment provided no relief.      Review of Systems  Constitutional: Positive for activity change, diaphoresis and fatigue. Negative for chills and fever.  HENT: Positive for congestion, ear pain, postnasal drip, rhinorrhea, sinus pressure, sinus pain and sore throat. Negative for trouble swallowing.   Eyes: Negative.   Respiratory: Positive for cough (semi productive ). Negative for shortness of breath and wheezing.   Cardiovascular: Negative.   Neurological: Positive for headaches.   Past Medical History:  Diagnosis Date  . Allergy   . Chicken pox   . Depression     Social History   Socioeconomic History  . Marital status: Married    Spouse name: Not on file  . Number of children: Not on file  . Years of education: Not on file  . Highest education level: Not on file  Social Needs  . Financial resource strain: Not on file  . Food insecurity - worry: Not on file  . Food insecurity - inability: Not on file  . Transportation needs - medical: Not on file  . Transportation needs - non-medical: Not on file  Occupational History  . Not on file  Tobacco Use  . Smoking status: Never Smoker  . Smokeless tobacco: Never Used  Substance and Sexual Activity  . Alcohol use: Yes    Alcohol/week: 0.0 oz    Comment: occassional  . Drug use: No  . Sexual activity: Not on file  Other Topics Concern  . 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    Married    Two daughters, one daughter on the way    He likes to sleep     History reviewed. No pertinent surgical history.  Family History  Problem Relation Age of Onset  . Heart disease Father   . Depression Maternal Grandmother        commited suicide    No Known Allergies  Current Outpatient Medications on File Prior to Visit  Medication Sig Dispense Refill  . Asenapine Maleate (SAPHRIS) 10 MG SUBL Place 5 mg under the tongue. Patient taking 5mg  daily.    . busPIRone (BUSPAR) 10 MG tablet Take 1 tablet by mouth daily.    . DULoxetine (CYMBALTA) 60 MG capsule Take 60 mg by mouth daily.  0   No current facility-administered medications on file prior to visit.     BP 128/80 (BP Location: Left Arm)   Temp 98.5 F (36.9 C) (Oral)   Wt 241 lb (109.3 kg)   BMI 30.53 kg/m       Objective:   Physical Exam  Constitutional: He is oriented to person, place, and time. He appears well-developed and well-nourished. No distress.  HENT:  Right Ear: Hearing, tympanic membrane, external ear and ear canal normal.  Left Ear: Hearing, tympanic membrane, external ear and ear canal normal.  Nose: Mucosal edema and rhinorrhea (yellow in color ) present. Right sinus exhibits maxillary sinus  tenderness. Right sinus exhibits no frontal sinus tenderness. Left sinus exhibits maxillary sinus tenderness. Left sinus exhibits no frontal sinus tenderness.  Mouth/Throat: Uvula is midline and mucous membranes are normal. Oropharyngeal exudate present.  Cardiovascular: Normal rate, regular rhythm, normal heart sounds and intact distal pulses. Exam reveals no gallop and no friction rub.  No murmur heard. Pulmonary/Chest: Effort normal and breath sounds normal. No respiratory distress. He has no wheezes. He has no rales. He exhibits no tenderness.  Neurological: He is alert and oriented to person, place, and time.  Skin: Skin is warm and dry. No rash noted. He  is not diaphoretic. No erythema. No pallor.  Psychiatric: He has a normal mood and affect. His behavior is normal. Judgment and thought content normal.  Nursing note and vitals reviewed.     Assessment & Plan:  1. Bacterial sinusitis - Will treat due to time frame and symptoms.  - amoxicillin-clavulanate (AUGMENTIN) 875-125 MG tablet; Take 1 tablet by mouth 2 (two) times daily for 7 days.  Dispense: 14 tablet; Refill: 0 - fluticasone (FLONASE) 50 MCG/ACT nasal spray; Place 2 sprays into both nostrils daily.  Dispense: 16 g; Refill: 6 - Follow up if no improvement in the next 2-3 days or sooner if needed  Shirline Freesory Okie Bogacz, NP

## 2017-11-11 ENCOUNTER — Encounter: Payer: Self-pay | Admitting: Adult Health

## 2017-11-13 NOTE — Telephone Encounter (Signed)
Form printed. Will complete and forward to provider for signature.

## 2017-11-16 ENCOUNTER — Ambulatory Visit: Payer: Commercial Managed Care - PPO

## 2017-11-16 DIAGNOSIS — Z139 Encounter for screening, unspecified: Secondary | ICD-10-CM

## 2017-11-16 NOTE — Progress Notes (Signed)
Waist circumference 41 inches. A copy of form was made and given to pt. Original form faxed to [phone].

## 2017-12-01 ENCOUNTER — Ambulatory Visit: Payer: Commercial Managed Care - PPO | Admitting: Adult Health

## 2017-12-01 ENCOUNTER — Encounter: Payer: Self-pay | Admitting: Adult Health

## 2017-12-01 VITALS — BP 118/90 | Temp 98.0°F | Wt 235.0 lb

## 2017-12-01 DIAGNOSIS — J029 Acute pharyngitis, unspecified: Secondary | ICD-10-CM | POA: Diagnosis not present

## 2017-12-01 DIAGNOSIS — B349 Viral infection, unspecified: Secondary | ICD-10-CM | POA: Diagnosis not present

## 2017-12-01 LAB — POCT INFLUENZA A/B
INFLUENZA B, POC: NEGATIVE
Influenza A, POC: NEGATIVE

## 2017-12-01 LAB — POCT RAPID STREP A (OFFICE): RAPID STREP A SCREEN: NEGATIVE

## 2017-12-01 NOTE — Progress Notes (Signed)
Subjective:    Patient ID: Aaron [name], male    DOB: 1976/05/25, 42 y.o.   MRN: [id number]  HPI 42 year old male who  has a past medical history of Allergy, Chicken pox, and Depression. He presents to the office today for concern of flu. He reports that his 40 year old daughter was diagnosed last week with influenza. He started to feel sick two days ago. His symptoms include low grade temp, nausea, sore throat,  abdominal cramping, sinus pressure,nasal congestion, with yellow drainage.   He did not get a flu shot this year   Review of Systems  Constitutional: Positive for chills and fever. Negative for fatigue.  HENT: Positive for sinus pressure and sore throat. Negative for rhinorrhea, sinus pain and trouble swallowing.   Gastrointestinal: Positive for abdominal pain (pressure), diarrhea and nausea.  Genitourinary: Negative.   Neurological: Negative.    Past Medical History:  Diagnosis Date  . Allergy   . Chicken pox   . Depression     Social History   Socioeconomic History  . Marital status: Married    Spouse name: Not on file  . Number of children: Not on file  . Years of education: Not on file  . Highest education level: Not on file  Social Needs  . Financial resource strain: Not on file  . Food insecurity - worry: Not on file  . Food insecurity - inability: Not on file  . Transportation needs - medical: Not on file  . Transportation needs - non-medical: Not on file  Occupational History  . Not on file  Tobacco Use  . Smoking status: Never Smoker  . Smokeless tobacco: Never Used  Substance and Sexual Activity  . Alcohol use: Yes    Alcohol/week: 0.0 oz    Comment: occassional  . Drug use: No  . Sexual activity: Not on file  Other Topics Concern  . 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    Married    Two daughters, one daughter on the way    He likes to sleep     History reviewed. No pertinent  surgical history.  Family History  Problem Relation Age of Onset  . Heart disease Father   . Depression Maternal Grandmother        commited suicide    No Known Allergies  Current Outpatient Medications on File Prior to Visit  Medication Sig Dispense Refill  . Asenapine Maleate (SAPHRIS) 10 MG SUBL Place 5 mg under the tongue. Patient taking 5mg  daily.    . busPIRone (BUSPAR) 10 MG tablet Take 1 tablet by mouth daily.    . DULoxetine (CYMBALTA) 60 MG capsule Take 60 mg by mouth daily.  0   No current facility-administered medications on file prior to visit.     BP 118/90 (BP Location: Left Arm)   Temp 98 F (36.7 C) (Oral)   Wt 235 lb (106.6 kg)   BMI 29.77 kg/m        Objective:   Physical Exam  Constitutional: He is oriented to person, place, and time. He appears well-developed and well-nourished.  HENT:  Right Ear: Hearing, tympanic membrane, external ear and ear canal normal.  Left Ear: Hearing, tympanic membrane, external ear and ear canal normal.  Nose: Mucosal edema and rhinorrhea present. Right sinus exhibits no maxillary sinus tenderness and no frontal sinus tenderness. Left sinus exhibits no maxillary sinus tenderness and no frontal sinus tenderness.  Mouth/Throat: Uvula is midline. Posterior oropharyngeal erythema present.  Cardiovascular: Normal rate, regular rhythm, normal heart sounds and intact distal pulses. Exam reveals no gallop and no friction rub.  No murmur heard. Pulmonary/Chest: Effort normal and breath sounds normal. No respiratory distress. He has no wheezes. He has no rales. He exhibits no tenderness.  Abdominal: There is generalized tenderness.  Neurological: He is alert and oriented to person, place, and time.  Skin: Skin is warm and dry. No rash noted. He is not diaphoretic. No erythema. No pallor.  Psychiatric: He has a normal mood and affect. His behavior is normal. Judgment and thought content normal.  Nursing note and vitals reviewed.      Assessment & Plan:  1. Viral illness  - POC Influenza A/B- negative  - Advised to wait on flu vaccination for at least 2 days due to illness but he should get it.  - Stay hydrated and rest.  - Follow up as needed   2. Sore throat - POC Rapid Strep A - negative   Shirline Frees, NP

## 2017-12-01 NOTE — Progress Notes (Signed)
Subjective:    Patient ID: Aaron [name], male    DOB: 04-Jan-1976, 42 y.o.   MRN: [id number]  Sore Throat   Associated symptoms include abdominal pain, congestion and diarrhea. Pertinent negatives include no coughing, ear discharge, ear pain or shortness of breath.  Diarrhea   Associated symptoms include abdominal pain and a fever. Pertinent negatives include no chills or coughing.    Daughter was diagnosed with flu last Thursday.  He started feeling badly 3 days ago.  He has sore throat, abdominal cramping, diarrhea, slight nasal congestion with yellow mucus, sinus pain and pressure. He had temp of 99.4 yesterday.  He denies chills, cough, fatigue or myalgia.     Review of Systems  Constitutional: Positive for fever. Negative for appetite change, chills and fatigue.  HENT: Positive for congestion, sinus pressure, sinus pain and sore throat. Negative for ear discharge, ear pain and sneezing.   Eyes: Negative.   Respiratory: Negative for cough, chest tightness, shortness of breath and wheezing.   Cardiovascular: Negative for chest pain and palpitations.  Gastrointestinal: Positive for abdominal pain and diarrhea. Negative for blood in stool.       1 loose stool a day for the past 2 days  Musculoskeletal: Negative.    Past Medical History:  Diagnosis Date  . Allergy   . Chicken pox   . Depression     Social History   Socioeconomic History  . Marital status: Married    Spouse name: Not on file  . Number of children: Not on file  . Years of education: Not on file  . Highest education level: Not on file  Social Needs  . Financial resource strain: Not on file  . Food insecurity - worry: Not on file  . Food insecurity - inability: Not on file  . Transportation needs - medical: Not on file  . Transportation needs - non-medical: Not on file  Occupational History  . Not on file  Tobacco Use  . Smoking status: Never Smoker  . Smokeless tobacco: Never Used  Substance and Sexual  Activity  . Alcohol use: Yes    Alcohol/week: 0.0 oz    Comment: occassional  . Drug use: No  . Sexual activity: Not on file  Other Topics Concern  . Not on file  Social History Narrative   Bachelors Degree, Administrator, arts for [employer].    Married    Two daughters, one daughter on the way    He likes to sleep     History reviewed. No pertinent surgical history.  Family History  Problem Relation Age of Onset  . Heart disease Father   . Depression Maternal Grandmother        commited suicide    No Known Allergies  Current Outpatient Medications on File Prior to Visit  Medication Sig Dispense Refill  . Asenapine Maleate (SAPHRIS) 10 MG SUBL Place 5 mg under the tongue. Patient taking 5mg  daily.    . busPIRone (BUSPAR) 10 MG tablet Take 1 tablet by mouth daily.    . DULoxetine (CYMBALTA) 60 MG capsule Take 60 mg by mouth daily.  0   No current facility-administered medications on file prior to visit.     BP 118/90 (BP Location: Left Arm)   Temp 98 F (36.7 C) (Oral)   Wt 235 lb (106.6 kg)   BMI 29.77 kg/m      Objective:   Physical Exam  Constitutional: He is oriented to person, place, and time. He appears  well-developed and well-nourished. No distress.  HENT:  Right Ear: Tympanic membrane normal.  Left Ear: Tympanic membrane normal.  Nose: Mucosal edema present. No rhinorrhea. Right sinus exhibits maxillary sinus tenderness. Right sinus exhibits no frontal sinus tenderness. Left sinus exhibits no maxillary sinus tenderness and no frontal sinus tenderness.  Mouth/Throat: Uvula is midline and mucous membranes are normal. Posterior oropharyngeal edema and posterior oropharyngeal erythema present. No oropharyngeal exudate.  Inferior turbinates are erythematous  Neck: Neck supple.  Cardiovascular: Normal rate, regular rhythm and normal heart sounds.  Pulmonary/Chest: Effort normal and breath sounds normal. No respiratory distress. He has no wheezes. He has  no rales.  Lymphadenopathy:    He has cervical adenopathy.  Tenderness of the right cervical lymph node  Neurological: He is alert and oriented to person, place, and time.  Skin: Skin is warm and dry.  Nursing note and vitals reviewed.     Assessment & Plan:  1. Viral illness Rest, encourages fluids.  Tylenol or Motrin for fever or discomfort. Symptomatic care.  - POC Influenza A/B  2. Sore throat Strep swab negative.  Symptomatic care. Warm salt water gargles.  Return to office if symptoms are not improving or worsen.  - POC Rapid Strep A

## 2018-01-08 ENCOUNTER — Other Ambulatory Visit: Payer: Self-pay

## 2018-01-08 ENCOUNTER — Encounter (HOSPITAL_COMMUNITY): Payer: Self-pay

## 2018-01-08 ENCOUNTER — Inpatient Hospital Stay (HOSPITAL_COMMUNITY)
Admission: RE | Admit: 2018-01-08 | Discharge: 2018-01-13 | DRG: 885 | Disposition: A | Discharge status: Home / Self Care | Payer: Commercial Managed Care - PPO | Attending: Psychiatry | Admitting: Psychiatry

## 2018-01-08 DIAGNOSIS — R45851 Suicidal ideations: Secondary | ICD-10-CM | POA: Diagnosis present

## 2018-01-08 DIAGNOSIS — F332 Major depressive disorder, recurrent severe without psychotic features: Secondary | ICD-10-CM | POA: Diagnosis present

## 2018-01-08 DIAGNOSIS — F419 Anxiety disorder, unspecified: Secondary | ICD-10-CM | POA: Diagnosis present

## 2018-01-08 DIAGNOSIS — F3341 Major depressive disorder, recurrent, in partial remission: Secondary | ICD-10-CM | POA: Diagnosis present

## 2018-01-08 DIAGNOSIS — F0789 Other personality and behavioral disorders due to known physiological condition: Secondary | ICD-10-CM | POA: Diagnosis not present

## 2018-01-08 DIAGNOSIS — R4589 Other symptoms and signs involving emotional state: Secondary | ICD-10-CM | POA: Diagnosis not present

## 2018-01-08 DIAGNOSIS — Z79899 Other long term (current) drug therapy: Secondary | ICD-10-CM

## 2018-01-08 DIAGNOSIS — Z818 Family history of other mental and behavioral disorders: Secondary | ICD-10-CM

## 2018-01-08 DIAGNOSIS — G4700 Insomnia, unspecified: Secondary | ICD-10-CM | POA: Diagnosis not present

## 2018-01-08 DIAGNOSIS — Z8249 Family history of ischemic heart disease and other diseases of the circulatory system: Secondary | ICD-10-CM | POA: Diagnosis not present

## 2018-01-08 LAB — COMPREHENSIVE METABOLIC PANEL
ALT: 27 U/L (ref 17–63)
AST: 28 U/L (ref 15–41)
Albumin: 4 g/dL (ref 3.5–5.0)
Alkaline Phosphatase: 54 U/L (ref 38–126)
Anion gap: 9 (ref 5–15)
BUN: 10 mg/dL (ref 6–20)
CHLORIDE: 105 mmol/L (ref 101–111)
CO2: 27 mmol/L (ref 22–32)
Calcium: 9.1 mg/dL (ref 8.9–10.3)
Creatinine, Ser: 1 mg/dL (ref 0.61–1.24)
GFR calc Af Amer: >60 mL/min (ref >60)
GFR calc non Af Amer: >60 mL/min (ref >60)
Glucose, Bld: 90 mg/dL (ref 65–99)
Potassium: 3.7 mmol/L (ref 3.5–5.1)
SODIUM: 141 mmol/L (ref 135–145)
Total Bilirubin: 0.4 mg/dL (ref 0.3–1.2)
Total Protein: 6.8 g/dL (ref 6.5–8.1)

## 2018-01-08 LAB — LIPID PANEL
CHOL/HDL RATIO: 2.3 ratio
CHOLESTEROL: 175 mg/dL (ref 0–200)
HDL: 75 mg/dL (ref >40)
LDL Cholesterol: 88 mg/dL (ref 0–99)
Triglycerides: 62 mg/dL (ref <150)
VLDL: 12 mg/dL (ref 0–40)

## 2018-01-08 LAB — CBC
HCT: 40.4 % (ref 39.0–52.0)
Hemoglobin: 13.7 g/dL (ref 13.0–17.0)
MCH: 31.4 pg (ref 26.0–34.0)
MCHC: 33.9 g/dL (ref 30.0–36.0)
MCV: 92.7 fL (ref 78.0–100.0)
PLATELETS: 293 10*3/uL (ref 150–400)
RBC: 4.36 MIL/uL (ref 4.22–5.81)
RDW: 13.1 % (ref 11.5–15.5)
WBC: 4.7 10*3/uL (ref 4.0–10.5)

## 2018-01-08 LAB — HEMOGLOBIN A1C
Hgb A1c MFr Bld: 4.5 % — ABNORMAL LOW (ref 4.8–5.6)
MEAN PLASMA GLUCOSE: 82.45 mg/dL

## 2018-01-08 LAB — TSH: TSH: 2.288 u[IU]/mL (ref 0.350–4.500)

## 2018-01-08 MED ORDER — ASENAPINE MALEATE 5 MG SL SUBL
5.0000 mg | SUBLINGUAL_TABLET | Freq: Every day | SUBLINGUAL | Status: DC
  Filled 2018-01-08: qty 1

## 2018-01-08 MED ORDER — LAMOTRIGINE 25 MG PO TABS
25.0000 mg | ORAL_TABLET | Freq: Every day | ORAL | Status: DC
  Administered 2018-01-08 – 2018-01-09 (×2): 25 mg via ORAL
  Filled 2018-01-08 (×4): qty 1

## 2018-01-08 MED ORDER — HYDROXYZINE HCL 25 MG PO TABS
25.0000 mg | ORAL_TABLET | Freq: Three times a day (TID) | ORAL | Status: DC | PRN
  Administered 2018-01-08 – 2018-01-12 (×3): 25 mg via ORAL
  Filled 2018-01-08 (×3): qty 1

## 2018-01-08 MED ORDER — MAGNESIUM HYDROXIDE 400 MG/5ML PO SUSP: 30.0000 mL | Freq: Every day | ORAL | Status: DC | PRN

## 2018-01-08 MED ORDER — ASENAPINE MALEATE 5 MG SL SUBL: 5.0000 mg | SUBLINGUAL_TABLET | Freq: Every day | SUBLINGUAL | Status: DC

## 2018-01-08 MED ORDER — ACETAMINOPHEN 325 MG PO TABS: 650.0000 mg | ORAL_TABLET | Freq: Four times a day (QID) | ORAL | Status: DC | PRN

## 2018-01-08 MED ORDER — ALUM & MAG HYDROXIDE-SIMETH 200-200-20 MG/5ML PO SUSP: 30.0000 mL | ORAL | Status: DC | PRN

## 2018-01-08 MED ORDER — TRAZODONE HCL 50 MG PO TABS: 50.0000 mg | ORAL_TABLET | Freq: Every evening | ORAL | Status: DC | PRN

## 2018-01-08 MED ORDER — DULOXETINE HCL 30 MG PO CPEP
90.0000 mg | ORAL_CAPSULE | Freq: Every day | ORAL | Status: DC
  Administered 2018-01-09 – 2018-01-13 (×5): 90 mg via ORAL
  Filled 2018-01-08 (×6): qty 3

## 2018-01-08 MED ORDER — BUSPIRONE HCL 10 MG PO TABS
10.0000 mg | ORAL_TABLET | Freq: Every day | ORAL | Status: DC
  Administered 2018-01-08 – 2018-01-12 (×5): 10 mg via ORAL
  Filled 2018-01-08 (×6): qty 1

## 2018-01-08 MED ORDER — LORAZEPAM 1 MG PO TABS
ORAL_TABLET | ORAL | Status: AC
  Administered 2018-01-08: 1 mg via ORAL
  Filled 2018-01-08: qty 1

## 2018-01-08 MED ORDER — LORAZEPAM 1 MG PO TABS
1.0000 mg | ORAL_TABLET | Freq: Four times a day (QID) | ORAL | Status: DC | PRN
Start: 2018-01-08 — End: 2018-01-13
  Administered 2018-01-08: 1 mg via ORAL
  Filled 2018-01-08: qty 1

## 2018-01-08 MED ORDER — TRAZODONE HCL 50 MG PO TABS
50.0000 mg | ORAL_TABLET | Freq: Every evening | ORAL | Status: DC | PRN
  Administered 2018-01-08 – 2018-01-12 (×6): 50 mg via ORAL
  Filled 2018-01-08 (×15): qty 1

## 2018-01-08 MED ORDER — ASENAPINE MALEATE 5 MG SL SUBL
5.0000 mg | SUBLINGUAL_TABLET | Freq: Every day | SUBLINGUAL | Status: DC
  Administered 2018-01-08 (×2): 5 mg via SUBLINGUAL
  Filled 2018-01-08 (×5): qty 1

## 2018-01-08 MED ORDER — DULOXETINE HCL 60 MG PO CPEP
60.0000 mg | ORAL_CAPSULE | Freq: Every day | ORAL | Status: DC
  Administered 2018-01-08: 60 mg via ORAL
  Filled 2018-01-08 (×2): qty 1

## 2018-01-08 MED ORDER — DULOXETINE HCL 30 MG PO CPEP
ORAL_CAPSULE | ORAL | Status: AC
  Administered 2018-01-08: 30 mg
  Filled 2018-01-08: qty 1

## 2018-01-08 MED ORDER — DULOXETINE HCL 30 MG PO CPEP
30.0000 mg | ORAL_CAPSULE | Freq: Once | ORAL | Status: AC
  Administered 2018-01-08: 30 mg via ORAL
  Filled 2018-01-08: qty 1
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
## 2018-01-08 NOTE — Progress Notes (Signed)
Recreation Therapy Notes  Date: 3.29.19 Time: 9:30 a.m. Location: [address] Dayroom   Group Topic: Stress Management   Goal Area(s) Addresses:  Goal 1.1: To reduce stress  -Patient will report feeling a reduction in stress level  -Patient will identify the importance of stress management  -Patient will participate during stress management group treatment     Intervention: Stress Management   Activity: Meditation- Patients were in a peaceful environment with soft lighting enhancing patients mood. Patients listened to a mindfulness meditation voiceover to help reduce stress levels    Education: Stress Management, Discharge Planning.    Education Outcome: Acknowledges edcuation/In group clarification offered/Needs additional education   Clinical Observations/Feedback:: Patient did not attend     Shulem Mader, Recreation Therapy Intern   Militza Devery 01/08/2018 8:27 AM 

## 2018-01-08 NOTE — BHH Counselor (Signed)
Writer completed SPE with patient's wife. She would like to have a family meeting prior to discharge.  Patient is welcome to discharge back to his shared home with his wife as they work toward separation and selling their house.

## 2018-01-08 NOTE — H&P (Signed)
Behavioral Health Medical Screening Exam  Aaron [name] is an 42 y.o. male.  Total Time spent with patient: 30 minutes  Psychiatric Specialty Exam: Physical Exam  Constitutional: He is oriented to person, place, and time. He appears well-developed and well-nourished. No distress.  HENT:  Head: Normocephalic and atraumatic.  Right Ear: External ear normal.  Left Ear: External ear normal.  Eyes: Conjunctivae are normal. Right eye exhibits no discharge. Left eye exhibits no discharge. No scleral icterus.  Cardiovascular: Normal rate.  Respiratory: Effort normal. No respiratory distress.  Musculoskeletal: Normal range of motion.  Neurological: He is alert and oriented to person, place, and time.  Skin: Skin is warm and dry. He is not diaphoretic.  Psychiatric: His speech is normal. His mood appears anxious. His affect is blunt. He is withdrawn. He is not actively hallucinating. Thought content is not paranoid and not delusional. Cognition and memory are normal. He expresses impulsivity and inappropriate judgment. He exhibits a depressed mood. He expresses suicidal ideation. He expresses no homicidal ideation. He expresses suicidal plans.    Review of Systems  Constitutional: Negative for chills, fever and weight loss.  Cardiovascular: Positive for chest pain.       Reports chest pain of 1/10. States that he feels it is related to anxiety.  Neurological: Negative for dizziness.  Psychiatric/Behavioral: Positive for depression, substance abuse and suicidal ideas. Negative for hallucinations and memory loss. The patient is nervous/anxious and has insomnia.   All other systems reviewed and are negative.   Blood pressure (!) 141/84, pulse 79, temperature 98.7 F (37.1 C), resp. rate 18, SpO2 99 %.There is no height or weight on file to calculate BMI.  General Appearance: Disheveled  Eye Contact:  Fair  Speech:  Clear and Coherent and Slow  Volume:  Decreased  Mood:  Anxious, Depressed,  Dysphoric, Hopeless and Worthless  Affect:  Blunt  Thought Process:  Coherent and Descriptions of Associations: Intact  Orientation:  Full (Time, Place, and Person)  Thought Content:  Logical and Hallucinations: None  Suicidal Thoughts:  Yes.  with intent/plan  Homicidal Thoughts:  No  Memory:  Immediate;   Fair Recent;   Fair  Judgement:  Impaired  Insight:  Lacking  Psychomotor Activity:  Decreased  Concentration: Concentration: Poor and Attention Span: Poor  Recall:  Fair  Fund of Knowledge:Good  Language: Good  Akathisia:  No  Handed:  Right  AIMS (if indicated):     Assets:  Communication Skills Desire for Improvement Financial Resources/Insurance Housing Intimacy Leisure Time Physical Health Transportation  Sleep:       Musculoskeletal: Strength & Muscle Tone: within normal limits Gait & Station: normal   Blood pressure (!) 141/84, pulse 79, temperature 98.7 F (37.1 C), resp. rate 18, SpO2 99 %.  Recommendations:  Based on my evaluation the patient does not appear to have an emergency medical condition.  Jackelyn PolingJason A Ettamae Barkett, NP 01/08/2018, 12:24 AM

## 2018-01-08 NOTE — BHH Suicide Risk Assessment (Signed)
B[facility] INPATIENT:  Family/Significant Other Suicide Prevention Education  Suicide Prevention Education:  Contact Attempts: patient's wife, Aaron [name] [phone]) has been identified by the patient as the family member/significant other with whom the patient will be residing, and identified as the person(s) who will aid the patient in the event of a mental health crisis.  With written consent from the patient, two attempts were made to provide suicide prevention education, prior to and/or following the patient's discharge.  We were unsuccessful in providing suicide prevention education.  A suicide education pamphlet was given to the patient to share with family/significant other.  Date and time of first attempt: 01/08/18  10:48am   Left VM requesting callback [phone])  Aaron [name] 01/08/2018, 10:49 AM

## 2018-01-08 NOTE — Progress Notes (Signed)
D: Pt passive SI-contract for safety. denies HI/AVH. Pt is pleasant and cooperative. Pt stated he was filling better due to not being as anxious. Pt was visible in the dayroom with peers this evening.   A: Pt was offered support and encouragement. Pt was given scheduled medications. Pt was encourage to attend groups. Q 15 minute checks were done for safety.   R:Pt attends groups and interacts well with peers and staff. Pt is taking medication. Pt receptive to treatment and safety maintained on unit.

## 2018-01-08 NOTE — BHH Suicide Risk Assessment (Signed)
[facility] INPATIENT:  Family/Significant Other Suicide Prevention Education  Suicide Prevention Education:  Education Completed; patient's wife, [name] [phone]), has been identified by the patient as the family member/significant other with whom the patient will be residing, and identified as the person(s) who will aid the patient in the event of a mental health crisis (suicidal ideations/suicide attempt).  With written consent from the patient, the family member/significant other has been provided the following suicide prevention education, prior to the and/or following the discharge of the patient.  The suicide prevention education provided includes the following:  Suicide risk factors  Suicide prevention and interventions  National Suicide Hotline telephone number  [facility] assessment telephone number  [facility] Emergency Assistance 911  [facility] and/or Residential Mobile Crisis Unit telephone number  Request made of family/significant other to:  Remove weapons (e.g., guns, rifles, knives), all items previously/currently identified as safety concern.    Remove drugs/medications (over-the-counter, prescriptions, illicit drugs), all items previously/currently identified as a safety concern.  The family member/significant other verbalizes understanding of the suicide prevention education information provided.  The family member/significant other agrees to remove the items of safety concern listed above.  Patient will return to his shared home with his wife as they work toward separation and selling their home. There are no guns or weapons in the home.  Patient's wife, Cala BradfordKimberly, shares that the patient was previously diagnosed with MDD, Anxiety, Paranoia, and Bipolar Disorder. Morrie Sheldonshley shares that the patient has not been consistent with his aftercare after [place] The patient has continued with medication  management but has not participated in outpatient therapy.   Morrie Sheldonshley shares that the patient has a history of suicidal ideation and has a "significant temper." Morrie Sheldonshley shares that the patient has manic episodes that last a few days at a time where the patient will spend excessive amounts of money, go without sleep, engage in risky behaviors (drinking and driving), and will get motivated to complete housework (pressure washing the house at 10pm). The patient also experiences pronounced periods of depression where he will isolate himself and neglect his hygiene.   Morrie Sheldonshley shares that the patient has a history of attending a couple sessions of therapy and abruptly quitting. The patient tried EMDR last year without significant signs of improvement. The patient has attended one therapeutic session with a counselor at [facility] in [place] on 01/06/18.  Darreld McleanCharlotte C Raeley Gilmore 01/08/2018, 12:25 PM

## 2018-01-08 NOTE — Tx Team (Signed)
Initial Treatment Plan 01/08/2018 1:50 AM Aaron [name] JYN:[id number]    PATIENT STRESSORS: Health problems Marital or family conflict   PATIENT STRENGTHS: Average or above average intelligence Capable of independent living Financial means General fund of knowledge Supportive family/friends   PATIENT IDENTIFIED PROBLEMS:   depression  anxiety  Risk for suicide  "get stabilized"             DISCHARGE CRITERIA:  Adequate post-discharge living arrangements Improved stabilization in mood, thinking, and/or behavior Motivation to continue treatment in a less acute level of care Need for constant or close observation no longer present Verbal commitment to aftercare and medication compliance  PRELIMINARY DISCHARGE PLAN: Attend PHP/IOP Outpatient therapy Participate in family therapy Return to previous living arrangement Return to previous work or school arrangements  PATIENT/FAMILY INVOLVEMENT: This treatment plan has been presented to and reviewed with the patient, Aaron [name],  The patient and family have been given the opportunity to ask questions and make suggestions.  JEHU-APPIAH, Khris Jansson K, RN 01/08/2018, 1:50 AM
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
## 2018-01-08 NOTE — BHH Group Notes (Signed)
LCSW Group Therapy Note 01/08/2018 2:18 PM  Type of Therapy and Topic: Group Therapy: Avoiding Self-Sabotaging and Enabling Behaviors  Participation Level: Did Not Attend  Description of Group:  In this group, patients will learn how to identify obstacles, self-sabotaging and enabling behaviors, as well as: what are they, why do we do them and what needs these behaviors meet. Discuss unhealthy relationships and how to have positive healthy boundaries with those that sabotage and enable. Explore aspects of self-sabotage and enabling in yourself and how to limit these self-destructive behaviors in everyday life.  Therapeutic Goals: 1. Patient will identify one obstacle that relates to self-sabotage and enabling behaviors 2. Patient will identify one personal self-sabotaging or enabling behavior they did prior to admission 3. Patient will state a plan to change the above identified behavior 4. Patient will demonstrate ability to communicate their needs through discussion and/or role play.   Summary of Patient Progress:  INVITED,CHOSE NOT TO ATTEND.    Therapeutic Modalities:  Cognitive Behavioral Therapy Person-Centered Therapy Motivational Interviewing   Baldo DaubJolan Roopa Graver LCSWA Clinical Social Worker
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
## 2018-01-08 NOTE — Plan of Care (Signed)
  Problem: Safety: Goal: Periods of time without injury will increase Outcome: Progressing Note:  Pt safe on the unit at this time    

## 2018-01-08 NOTE — H&P (Signed)
Psychiatric Admission Assessment Adult  Patient Identification: Aaron [name] MRN:  [id number] Date of Evaluation:  01/08/2018 Chief Complaint:  mdd recurrent severe Principal Diagnosis: Bipolar disorder, most recently depressed, severe without psychotic features versus major depression, recurrent, severe without psychotic features Diagnosis: Bipolar disorder, most recently depressed, severe without psychotic features Patient Active Problem List   Diagnosis Date Noted  . Severe recurrent major depression without psychotic features ([place]) [F33.2] 01/08/2018   History of Present Illness: Patient is seen and examined.  Patient is a 42 year old male with a reported past psychiatric history for depression (that his wife tells [place] he is been previously diagnosed with bipolar disorder) who presented to the [facility] emergency department with suicidal ideation.  The patient stated that he had a long history of depression over the last 2-3 years.  He had actually had a 6-7-week admission at a facility in [place] for similar symptoms.  He stated that he had been having trouble most recently because of the breakup of his marriage.  He stated that his current wife had told him that they were breaking up, and since then he developed more helplessness, hopelessness and worthlessness as well as suicidal ideation.  Unfortunately this is his second breakup in the last several years.  He has an ex-wife with 2 other children that live in another state.  His current wife has an 64-monthold child that is their biological child.  While the patient denied any manic symptoms, his wife stated that his previous psychiatrist had diagnosed him with bipolar disorder, and the patient would often have anger issues.  It sounds as though that contributed significantly to their marital status.  He denied any racing thoughts, pressured speech.  He denied any excessive spending.  He denied being awake for several days at a time without  getting tired.  He stated his current medications include Cymbalta, Saphris, Valium, BuSpar.  He stated he had previously been on higher dosages of Cymbalta, but was now on 60 mg.  He denied any previous suicide attempts, but had thoughts of suicide in the past.  He was admitted to the hospital for evaluation and stabilization.  He was also seen by treatment team today.  This included nursing and social work. Associated Signs/Symptoms: Depression Symptoms:  depressed mood, anhedonia, psychomotor retardation, fatigue, feelings of worthlessness/guilt, difficulty concentrating, hopelessness, suicidal thoughts without plan, anxiety, loss of energy/fatigue, (Hypo) Manic Symptoms:  Impulsivity, Irritable Mood, Anxiety Symptoms:  Excessive Worry, Psychotic Symptoms:  None PTSD Symptoms: Negative Total Time spent with patient: 45 minutes  Past Psychiatric History: Depending on his story versus his wife's story he has a history of depression or bipolar disorder, most recently depressed.  [place]  He was there for somewhere between 6-7 weeks.  Is the patient at risk to self? Yes.    Has the patient been a risk to self in the past 6 months? Yes.    Has the patient been a risk to self within the distant past? No.  Is the patient a risk to others? No.  Has the patient been a risk to others in the past 6 months? No.  Has the patient been a risk to others within the distant past? No.   Prior Inpatient Therapy: Prior Inpatient Therapy: Yes Prior Therapy Dates: SEPT 2018 Prior Therapy [facility] SI, MD[facility] Prior Outpatient Therapy: Prior Outpatient Therapy: Yes Prior Therapy Dates: CONTINUOUS FOR  LAST 6 YEARS Prior Therapy Facilty/Provider(s): MULTIPLE Reason for Treatment: MDD, GAD Does patient have an ACCT team?: No Does patient have Intensive In-House Services?   : No Does patient have [facility] services? : No Does patient have P4CC services?: No  Alcohol Screening: 1. How often do you have a drink containing alcohol?: Monthly or less 2. How many drinks containing alcohol do you have on a typical day when you are drinking?: 3 or 4 3. How often do you have six or more drinks on one occasion?: Less than monthly AUDIT-C Score: 3 4. How often during the last year have you found that you were not able to stop drinking once you had started?: Never 5. How often during the last year have you failed to do what was normally expected from you becasue of drinking?: Never 6. How often during the last year have you needed a first drink in the morning to get yourself going after a heavy drinking session?: Never 7. How often during the last year have you had a feeling of guilt of remorse after drinking?: Never 8. How often during the last year have you been unable to remember what happened the night before because you had been drinking?: Never 9. Have you or someone else been injured as a result of your drinking?: No 10. Has a relative or friend or a doctor or another health worker been concerned about your drinking or suggested you cut down?: No Alcohol Use Disorder Identification Test Final Score (AUDIT): 3 Intervention/Follow-up: AUDIT Score <7 follow-up not indicated Substance Abuse History in the last 12 months:  No. Consequences of Substance Abuse: Negative Previous Psychotropic Medications: Yes  Psychological Evaluations: Yes  Past Medical History:  Past Medical History:  Diagnosis Date  . Allergy   . Chicken pox   . Depression    History reviewed. No pertinent surgical history. Family History:  Family History  Problem Relation Age of Onset  . Heart disease Father   . Depression Maternal Grandmother        commited suicide   Family Psychiatric  History: He stated his sister had depression Tobacco Screening: Have you used any form of tobacco in the  last 30 days? (Cigarettes, Smokeless Tobacco, Cigars, and/or Pipes): No Social History:  Social History   Substance and Sexual Activity  Alcohol Use Yes  . Alcohol/week: 0.0 oz   Comment: occassional     Social History   Substance and Sexual Activity  Drug Use No    Additional Social History: Marital status: Separated Separated, when?: Patient reports he and his wife sepereated 3 weeks ago.  What types of issues is patient dealing with in the relationship?: Patient reports he and his wife experienced stressors that included financial insecurities job insecurities and personal disagreements.  Additional relationship information: N/A Are you sexually active?: No What is your sexual orientation?: Heterosexual Has your sexual activity been affected by drugs, alcohol, medication, or emotional stress?: No Does patient have children?: Yes How many children?: 3 How is patient's relationship with their children?: Patient reports having "a pretty good relationship" with his two daughters and only son. Patient reports experieincng guilt due to working so much and not having much time with his children.     Prescriptions: SAPHRIS, BUSPIRONE HCL, DULOXETINE DR, DIAZEPAM, ALLERGY MEDICINE (DULOXETINE DR NEW AS FO ABOUT 1 MONTH AGO) History of alcohol / drug use?: Yes Longest period of sobriety (when/how long): UNKNOWN Name of Substance 1: ALCOHOL 1 - Age of First Use:  TEEN 1 - Amount (size/oz): MORE THAN 4 BEERS DAILY ON THE WEEKENDS 1 - Frequency: WEEKENDS ONLY 1 - Duration: ONGOING 1 - Last Use / Amount: LAST WEEKEND                  Allergies:  No Known Allergies Lab Results:  Results for orders placed or performed during the hospital encounter of 01/08/18 (from the past 48 hour(s))  CBC     Status: None   Collection Time: 01/08/18  6:23 AM  Result Value Ref Range   WBC 4.7 4.0 - 10.5 K/uL   RBC 4.36 4.22 - 5.81 MIL/uL   Hemoglobin 13.7 13.0 - 17.0 g/dL   HCT 40.4 39.0 -  52.0 %   MCV 92.7 78.0 - 100.0 fL   MCH 31.4 26.0 - 34.0 pg   MCHC 33.9 30.0 - 36.0 g/dL   RDW 13.1 11.5 - 15.5 %   Platelets 293 150 - 400 K/uL    Comment: Performed at [facility], [place] [address], [place], [place] [address]  Comprehensive metabolic panel     Status: None   Collection Time: 01/08/18  6:23 AM  Result Value Ref Range   Sodium 141 135 - 145 mmol/L   Potassium 3.7 3.5 - 5.1 mmol/L   Chloride 105 101 - 111 mmol/L   CO2 27 22 - 32 mmol/L   Glucose, Bld 90 65 - 99 mg/dL   BUN 10 6 - 20 mg/dL   Creatinine, Ser 1.00 0.61 - 1.24 mg/dL   Calcium 9.1 8.9 - 10.3 mg/dL   Total Protein 6.8 6.5 - 8.1 g/dL   Albumin 4.0 3.5 - 5.0 g/dL   AST 28 15 - 41 U/L   ALT 27 17 - 63 U/L   Alkaline Phosphatase 54 38 - 126 U/L   Total Bilirubin 0.4 0.3 - 1.2 mg/dL   GFR calc non Af Amer >60 >60 mL/min   GFR calc Af Amer >60 >60 mL/min    Comment: (NOTE) The eGFR has been calculated using the CKD EPI equation. This calculation has not been validated in all clinical situations. eGFR's persistently <60 mL/min signify possible Chronic Kidney Disease.    Anion gap 9 5 - 15    Comment: Performed at [facility], [place] [address], [place], [place] [address]  Hemoglobin A1c     Status: Abnormal   Collection Time: 01/08/18  6:23 AM  Result Value Ref Range   Hgb A1c MFr Bld 4.5 (L) 4.8 - 5.6 %    Comment: (NOTE) Pre diabetes:          5.7%-6.4% Diabetes:              >6.4% Glycemic control for   <7.0% adults with diabetes    Mean Plasma Glucose 82.45 mg/dL    Comment: Performed at [facility] [address] [place],  [address]  Lipid panel     Status: None   Collection Time: 01/08/18  6:23 AM  Result Value Ref Range   Cholesterol 175 0 - 200 mg/dL   Triglycerides 62 <150 mg/dL   HDL 75 >40 mg/dL   Total CHOL/HDL Ratio 2.3 RATIO   VLDL 12 0 - 40 mg/dL   LDL Cholesterol 88 0 - 99 mg/dL    Comment:        Total Cholesterol/HDL:CHD  Risk Coronary Heart Disease Risk Table  Men   Women  1/2 Average Risk   3.4   3.3  Average Risk       5.0   4.4  2 X Average Risk   9.6   7.1  3 X Average Risk  23.4   11.0        Use the calculated Patient Ratio above and the CHD Risk Table to determine the patient's CHD Risk.        ATP III CLASSIFICATION (LDL):  <100     mg/dL   Optimal  100-129  mg/dL   Near or Above                    Optimal  130-159  mg/dL   Borderline  160-189  mg/dL   High  >190     mg/dL   Very High Performed at [facility] [address], [place], [place] [address]   TSH     Status: None   Collection Time: 01/08/18  6:23 AM  Result Value Ref Range   TSH 2.288 0.350 - 4.500 uIU/mL    Comment: Performed by a 3rd Generation assay with a functional sensitivity of <=0.01 uIU/mL. Performed at [facility], [place] [address], [place], [place] [address]     Blood Alcohol level:  No results found for: [facility]  Metabolic Disorder Labs:  Lab Results  Component Value Date   HGBA1C 4.5 (L) 01/08/2018   MPG 82.45 01/08/2018   No results found for: PROLACTIN Lab Results  Component Value Date   CHOL 175 01/08/2018   TRIG 62 01/08/2018   HDL 75 01/08/2018   CHOLHDL 2.3 01/08/2018   VLDL 12 01/08/2018   LDLCALC 88 01/08/2018   LDLCALC 96 11/14/2016    Current Medications: Current Facility-Administered Medications  Medication Dose Route Frequency Provider Last Rate Last Dose  . acetaminophen (TYLENOL) tablet 650 mg  650 mg Oral Q6H PRN Lindon Romp A, NP      . alum & mag hydroxide-simeth (MAALOX/MYLANTA) 200-200-20 MG/5ML suspension 30 mL  30 mL Oral Q4H PRN Lindon Romp A, NP      . asenapine (SAPHRIS) sublingual tablet 5 mg  5 mg Sublingual Daily Lindon Romp A, NP   5 mg at 01/08/18 0118  . busPIRone (BUSPAR) tablet 10 mg  10 mg Oral Daily Lindon Romp A, NP   10 mg at 01/08/18 1004  . [START ON 01/09/2018] DULoxetine (CYMBALTA) DR capsule 90 mg   90 mg Oral Daily Sharma Covert, MD      . hydrOXYzine (ATARAX/VISTARIL) tablet 25 mg  25 mg Oral TID PRN Lindon Romp A, NP      . LORazepam (ATIVAN) tablet 1 mg  1 mg Oral Q6H PRN Sharma Covert, MD   1 mg at 01/08/18 1004  . magnesium hydroxide (MILK OF MAGNESIA) suspension 30 mL  30 mL Oral Daily PRN Lindon Romp A, NP      . traZODone (DESYREL) tablet 50 mg  50 mg Oral QHS,MR X 1 Lindon Romp A, NP   50 mg at 01/08/18 0118   PTA Medications: Medications Prior to Admission  Medication Sig Dispense Refill Last Dose  . Asenapine Maleate (SAPHRIS) 10 MG SUBL Place 5 mg under the tongue. Patient taking 59m daily.   Past Week at Unknown time  . busPIRone (BUSPAR) 10 MG tablet Take 1 tablet by mouth daily.   Past Week at Unknown time  . cetirizine (ZYRTEC) 10 MG  tablet Take 10 mg by mouth daily.   Past Week at Unknown time  . diazepam (VALIUM) 5 MG tablet Take 5 mg by mouth 2 (two) times daily.   Past Week at Unknown time  . DULoxetine (CYMBALTA) 60 MG capsule Take 60 mg by mouth daily.  0 Past Week at Unknown time    Musculoskeletal: Strength & Muscle Tone: within normal limits Gait & Station: normal Patient leans: N/A  Psychiatric Specialty Exam: Physical Exam  Constitutional: He is oriented to person, place, and time. He appears well-developed and well-nourished.  HENT:  Head: Normocephalic and atraumatic.  Respiratory: Effort normal.  Neurological: He is alert and oriented to person, place, and time.    Review of Systems  All other systems reviewed and are negative.   Blood pressure 136/82, pulse 87, temperature 98.5 F (36.9 C), temperature source Oral, resp. rate 16, height _0  (1.905 m), weight 106.1 kg (234 lb), SpO2 99 %.Body mass index is 29.25 kg/m.  General Appearance: Disheveled  Eye Contact:  Minimal  Speech:  Normal Rate  Volume:  Decreased  Mood:  Depressed  Affect:  Congruent  Thought Process:  Coherent  Orientation:  Full (Time, Place, and Person)   Thought Content:  Logical  Suicidal Thoughts:  Yes.  without intent/plan  Homicidal Thoughts:  No  Memory:  Immediate;   Fair  Judgement:  Impaired  Insight:  Lacking  Psychomotor Activity:  Decreased  Concentration:  Concentration: Fair  Recall:  Good  Fund of Knowledge:  Good  Language:  Good  Akathisia:  No  Handed:  Right  AIMS (if indicated):     Assets:  Communication Skills Desire for Improvement Financial Resources/Insurance Physical Health  ADL's:  Intact  Cognition:  WNL  Sleep:  Number of Hours: 3.25    Treatment Plan Summary: Daily contact with patient to assess and evaluate symptoms and progress in treatment, Medication management and Plan Patient is seen and examined.  Patient is a 42 year old male with the above-stated past psychiatric history was admitted with worsening depression and suicidality.  He will be admitted to the inpatient service.  Were going to increase his Cymbalta up to 90 mg p.o. daily.  We will also get more collateral information from his wife as well as his outpatient psychiatrist.  We will continue the BuSpar, but I am switching him over to lorazepam versus the Valium.  Hopefully we can wean him off of that.  We will continue the Saphris at this point, but because of the evidence of what sounds like bipolar disorder I am going to add Lamictal 25 mg p.o. nightly.  We will do that for mood stability and also to augment the Cymbalta dosage.  We will integrate him into the milieu, and use psychotherapeutic interventions to offer him supportive psychotherapy and also to help him cope better with his current life stressors.  Observation Level/Precautions:  15 minute checks  Laboratory:  CBC Chemistry Profile UDS  Psychotherapy:    Medications:    Consultations:    Discharge Concerns:    Estimated LOS:  Other:     Physician Treatment Plan for Primary Diagnosis: <principal problem not specified> Long Term Goal(s): Improvement in symptoms so as  ready for discharge  Short Term Goals: Ability to identify changes in lifestyle to reduce recurrence of condition will improve, Ability to verbalize feelings will improve, Ability to disclose and discuss suicidal ideas, Ability to demonstrate self-control will improve, Ability to identify and develop effective coping behaviors  will improve, Ability to maintain clinical measurements within normal limits will improve and Compliance with prescribed medications will improve  Physician Treatment Plan for Secondary Diagnosis: Active Problems:   Severe recurrent major depression without psychotic features ([place])  Long Term Goal(s): Improvement in symptoms so as ready for discharge  Short Term Goals: Ability to identify changes in lifestyle to reduce recurrence of condition will improve, Ability to verbalize feelings will improve, Ability to disclose and discuss suicidal ideas, Ability to demonstrate self-control will improve, Ability to identify and develop effective coping behaviors will improve, Ability to maintain clinical measurements within normal limits will improve and Compliance with prescribed medications will improve  I certify that inpatient services furnished can reasonably be expected to improve the patient's condition.    Sharma Covert, MD 3/29/20192:23 PM

## 2018-01-08 NOTE — Progress Notes (Signed)
D Patient is observed OOB UAL on the 400 hall today. HE is depressed,his face is flat, dark, he is unshaven, he makes poor eye contact he speaks very slowly and quietly, without making eye contact to whomever he's speaking. HE is in distress. HE explains to members of his treatment teams ( RN, MD and SW), how the events of his marital discord in addition to his depression in additions to his isolation aqll added to him feeling quite alone, hopeless and suicidal.   A HE completed his daily assessment and on this he wrote  He denied having SI today and he rated his depression, hopelessness and anxiety " 3.5/ 5/3.5", respectively. HE shares that he is " not sure" if he can return to his home, [facility] and this in itself is very overwhelming for him to contemplate. He admitted in treatment team today that his isolating ( when he gets disappointed and / or angry with his wife) only further concentrates and amplifies his wife's feelings of disconnected form him and he says " I know it causes us problems...but this is what I do .Marland [employer]. Need to learn how to do something else".   R Safety is in place. Writer spoke with pt and pt has homework assign to complete today.

## 2018-01-08 NOTE — Tx Team (Signed)
Interdisciplinary Treatment and Diagnostic Plan Update  01/08/2018 Time of Session: 9:30am Aaron [name] MRN: [id number]  Principal Diagnosis: <principal problem not specified>  Secondary Diagnoses: Active Problems:   Severe recurrent major depression without psychotic features (HCC)   Current Medications:  Current Facility-Administered Medications  Medication Dose Route Frequency Provider Last Rate Last Dose  . acetaminophen (TYLENOL) tablet 650 mg  650 mg Oral Q6H PRN Lindon Romp A, NP      . alum & mag hydroxide-simeth (MAALOX/MYLANTA) 200-200-20 MG/5ML suspension 30 mL  30 mL Oral Q4H PRN Lindon Romp A, NP      . asenapine (SAPHRIS) sublingual tablet 5 mg  5 mg Sublingual Daily Lindon Romp A, NP   5 mg at 01/08/18 0118  . busPIRone (BUSPAR) tablet 10 mg  10 mg Oral Daily Lindon Romp A, NP   10 mg at 01/08/18 1004  . [START ON 01/09/2018] DULoxetine (CYMBALTA) DR capsule 90 mg  90 mg Oral Daily Sharma Covert, MD      . hydrOXYzine (ATARAX/VISTARIL) tablet 25 mg  25 mg Oral TID PRN Rozetta Nunnery, NP      . lamoTRIgine (LAMICTAL) tablet 25 mg  25 mg Oral QHS Sharma Covert, MD      . LORazepam (ATIVAN) tablet 1 mg  1 mg Oral Q6H PRN Sharma Covert, MD   1 mg at 01/08/18 1004  . magnesium hydroxide (MILK OF MAGNESIA) suspension 30 mL  30 mL Oral Daily PRN Lindon Romp A, NP      . traZODone (DESYREL) tablet 50 mg  50 mg Oral QHS,MR X 1 Lindon Romp A, NP   50 mg at 01/08/18 0118   PTA Medications: Medications Prior to Admission  Medication Sig Dispense Refill Last Dose  . Asenapine Maleate (SAPHRIS) 10 MG SUBL Place 5 mg under the tongue. Patient taking 82m daily.   Past Week at Unknown time  . busPIRone (BUSPAR) 10 MG tablet Take 1 tablet by mouth daily.   Past Week at Unknown time  . cetirizine (ZYRTEC) 10 MG tablet Take 10 mg by mouth daily.   Past Week at Unknown time  . diazepam (VALIUM) 5 MG tablet Take 5 mg by mouth 2 (two) times daily.   Past Week at Unknown  time  . DULoxetine (CYMBALTA) 60 MG capsule Take 60 mg by mouth daily.  0 Past Week at Unknown time    Patient Stressors: Health problems Marital or family conflict  Patient Strengths: Average or above average intelligence Capable of independent living FFacilities managerfund of knowledge Supportive family/friends  Treatment Modalities: Medication Management, Group therapy, Case management,  1 to 1 session with clinician, Psychoeducation, Recreational therapy.   Physician Treatment Plan for Primary Diagnosis: <principal problem not specified> Long Term Goal(s): Improvement in symptoms so as ready for discharge Improvement in symptoms so as ready for discharge   Short Term Goals: Ability to identify changes in lifestyle to reduce recurrence of condition will improve Ability to verbalize feelings will improve Ability to disclose and discuss suicidal ideas Ability to demonstrate self-control will improve Ability to identify and develop effective coping behaviors will improve Ability to maintain clinical measurements within normal limits will improve Compliance with prescribed medications will improve Ability to identify changes in lifestyle to reduce recurrence of condition will improve Ability to verbalize feelings will improve Ability to disclose and discuss suicidal ideas Ability to demonstrate self-control will improve Ability to identify and develop effective coping behaviors will improve Ability to  maintain clinical measurements within normal limits will improve Compliance with prescribed medications will improve  Medication Management: Evaluate patient's response, side effects, and tolerance of medication regimen.  Therapeutic Interventions: 1 to 1 sessions, Unit Group sessions and Medication administration.  Evaluation of Outcomes: Progressing  Physician Treatment Plan for Secondary Diagnosis: Active Problems:   Severe recurrent major depression without psychotic  features ([place])  Long Term Goal(s): Improvement in symptoms so as ready for discharge Improvement in symptoms so as ready for discharge   Short Term Goals: Ability to identify changes in lifestyle to reduce recurrence of condition will improve Ability to verbalize feelings will improve Ability to disclose and discuss suicidal ideas Ability to demonstrate self-control will improve Ability to identify and develop effective coping behaviors will improve Ability to maintain clinical measurements within normal limits will improve Compliance with prescribed medications will improve Ability to identify changes in lifestyle to reduce recurrence of condition will improve Ability to verbalize feelings will improve Ability to disclose and discuss suicidal ideas Ability to demonstrate self-control will improve Ability to identify and develop effective coping behaviors will improve Ability to maintain clinical measurements within normal limits will improve Compliance with prescribed medications will improve     Medication Management: Evaluate patient's response, side effects, and tolerance of medication regimen.  Therapeutic Interventions: 1 to 1 sessions, Unit Group sessions and Medication administration.  Evaluation of Outcomes: Not Met   RN Treatment Plan for Primary Diagnosis: <principal problem not specified> Long Term Goal(s): Knowledge of disease and therapeutic regimen to maintain health will improve  Short Term Goals: Ability to demonstrate self-control, Ability to verbalize feelings will improve, Ability to disclose and discuss suicidal ideas and Compliance with prescribed medications will improve  Medication Management: RN will administer medications as ordered by provider, will assess and evaluate patient's response and provide education to patient for prescribed medication. RN will report any adverse and/or side effects to prescribing provider.  Therapeutic Interventions: 1 on 1  counseling sessions, Psychoeducation, Medication administration, Evaluate responses to treatment, Monitor vital signs and CBGs as ordered, Perform/monitor CIWA, COWS, AIMS and Fall Risk screenings as ordered, Perform wound care treatments as ordered.  Evaluation of Outcomes: Progressing   LCSW Treatment Plan for Primary Diagnosis: <principal problem not specified> Long Term Goal(s): Safe transition to appropriate next level of care at discharge, Engage patient in therapeutic group addressing interpersonal concerns.  Short Term Goals: Engage patient in aftercare planning with referrals and resources, Increase social support, Increase emotional regulation, Identify triggers associated with mental health/substance abuse issues and Increase skills for wellness and recovery  Therapeutic Interventions: Assess for all discharge needs, 1 to 1 time with Social worker, Explore available resources and support systems, Assess for adequacy in community support network, Educate family and significant other(s) on suicide prevention, Complete Psychosocial Assessment, Interpersonal group therapy.  Evaluation of Outcomes: Not Met   Progress in Treatment: Attending groups: No. Participating in groups: No. Taking medication as prescribed: Yes. Toleration medication: Yes. Family/Significant other contact made: Yes, individual(s) contacted:  patient's wife Patient understands diagnosis: Yes. Discussing patient identified problems/goals with staff: Yes. Medical problems stabilized or resolved: Yes. Denies suicidal/homicidal ideation: No. Passive SI Issues/concerns per patient self-inventory: Yes. Separation with wife.  New problem(s) identified: Yes, Describe:  CSW continuing to assess.  New Short Term/Long Term Goal(s): Patient's goal "get stabilized. Come to term with things (separation with wife/custody arrangements)."  Discharge Plan or Barriers: Patient to discharge home.  Reason for Continuation of  Hospitalization: Anxiety Depression Mania  Suicidal ideation  Estimated Length of Stay: Continuing to assess  Attendees: Patient: Aaron [name] 01/08/2018 2:40 PM  Physician: Queen Blossom 01/08/2018 2:40 PM  Nursing: Chong Sicilian, RN 01/08/2018 2:40 PM  RN Care Manager: 01/08/2018 2:40 PM  Social Worker: Stephanie Acre, Social Work Intern 01/08/2018 2:40 PM  Recreational Therapist:  01/08/2018 2:40 PM  Other:  01/08/2018 2:40 PM  Other:  01/08/2018 2:40 PM  Other: 01/08/2018 2:40 PM    Scribe for Treatment Team: Joellen Jersey, [place] Work 01/08/2018 2:40 PM

## 2018-01-09 LAB — PROLACTIN: PROLACTIN: 62.2 ng/mL — AB (ref 4.0–15.2)

## 2018-01-09 MED ORDER — ASENAPINE MALEATE 5 MG SL SUBL
5.0000 mg | SUBLINGUAL_TABLET | Freq: Every day | SUBLINGUAL | Status: DC
  Administered 2018-01-09 – 2018-01-12 (×4): 5 mg via SUBLINGUAL
  Filled 2018-01-09 (×5): qty 1

## 2018-01-09 NOTE — Progress Notes (Signed)
D.  Pt pleasant but anxious on approach, no other complaints voiced.  Pt stated that he would like to talk 1:1 with a Training and development officersocial worker tomorrow.  Pt was observed interacting appropriately with peers on the unit.  Pt denies SI/HI/AVH at this time but did report passive SI on dayshift.  He does contract for safety on the unit.  A.  Support and encouragement offered, medication given as ordered  R. Pt remains safe on the unit, will continue to monitor.

## 2018-01-09 NOTE — Progress Notes (Signed)
D Patient is observed OOb UAL on the 400 hall today. HE is reserved. Quiet. Jumpy and sad. HE makes better eye contact with this writer today and he speaks a little louder. He reports he didn't sleep " that great" last night .  A HE completes his daily assessment and on this he wrote he denied SI today and he rates his feelings of depression, hopelessness and anxiety " 3/5/3", respectively. He says he is interested in learning how to manage his high anxiety and feels like he's " always had anxiety".  He is observed sitting in the 400 hall dayroom, working on a puzzle.   R Safety is in place. Pt compliant with poc. Cont to foster therapeutic relationship.

## 2018-01-09 NOTE — BHH Group Notes (Signed)
LCSW Group Therapy Note  01/09/2018   9:30-10:30am (300 hall)                10:30-11:30am (400 hall)                11:30am-12:00pm (500 hall)  Type of Therapy and Topic:  Group Therapy: Anger Cues and Responses  Participation Level:  Active   Description of Group:   In this group, patients learned how to recognize the physical, cognitive, emotional, and behavioral responses they have to anger-provoking situations.  They identified a recent time they became angry and how they reacted.  They analyzed how their reaction was possibly beneficial and how it was possibly unhelpful.  The group discussed a variety of healthier coping skills that could help with such a situation in the future.  Deep breathing was practiced briefly.  Therapeutic Goals: 1. Patients will remember their last incident of anger and how they felt emotionally and physically, what their thoughts were at the time, and how they behaved. 2. Patients will identify how their behavior at that time worked for them, as well as how it worked against them. 3. Patients will explore possible new behaviors to use in future anger situations. 4. Patients will learn that anger itself is normal and cannot be eliminated, and that healthier reactions can assist with resolving conflict rather than worsening situations.  Summary of Patient Progress:  The patient shared that his most recent time of anger was Thursday and said his wife asked for a separation and when he suggested alternatives and ways to work on their relationship, she was not willing.  He was initially very quiet and then became quiet open about what his thought process has been.  Therapeutic Modalities:   Cognitive Behavioral Therapy  Lynnell ChadMareida J Grossman-Orr  01/09/2018 12:00pm

## 2018-01-09 NOTE — Progress Notes (Signed)
[facility] MD Progress Note  01/09/2018 2:06 PM Aaron [name]  MRN:  [id number] Subjective: Patient seen and examined.  Patient is a 42 year old male with a past psychiatric history significant for either major depression which is severe without psychotic features versus bipolar disorder; depressed, severe.  He seen in follow-up.  He stated he feels a little bit better today.  His suicidal ideation has decreased.  He is coping a little bit better.  His sleep was a little disturbed last night, and he did take the trazodone.  Unfortunately he had been taking the Saphris at bedtime, and he got it at daytime today.  We changed that in his medication regimen.  He has talked to his wife a little bit, and he tried to get in touch with his son right now, but he is taking nap.  He denied any side effects to his current medications.  He seems to be tolerating the addition of the Lamictal without difficulty.  We discussed his wife's concerns that she voiced about the possibility of bipolar disorder, but he continues to deny those symptoms. Principal Problem: <principal problem not specified> Diagnosis:   Patient Active Problem List   Diagnosis Date Noted  . Severe recurrent major depression without psychotic features ([place]) [F33.2] 01/08/2018   Total Time spent with patient: 20 minutes  Past Psychiatric History: See admission H&P  Past Medical History:  Past Medical History:  Diagnosis Date  . Allergy   . Chicken pox   . Depression    History reviewed. No pertinent surgical history. Family History:  Family History  Problem Relation Age of Onset  . Heart disease Father   . Depression Maternal Grandmother        commited suicide   Family Psychiatric  History: See admission H&P Social History:  Social History   Substance and Sexual Activity  Alcohol Use Yes  . Alcohol/week: 0.0 oz   Comment: occassional     Social History   Substance and Sexual Activity  Drug Use No    Social History    Socioeconomic History  . Marital status: Married    Spouse name: Not on file  . Number of children: Not on file  . Years of education: Not on file  . Highest education level: Not on file  Occupational History  . Not on file  Social Needs  . Financial resource strain: Not on file  . Food insecurity:    Worry: Not on file    Inability: Not on file  . Transportation needs:    Medical: Not on file    Non-medical: Not on file  Tobacco Use  . Smoking status: Never Smoker  . Smokeless tobacco: Never Used  Substance and Sexual Activity  . Alcohol use: Yes    Alcohol/week: 0.0 oz    Comment: occassional  . Drug use: No  . Sexual activity: Yes  Lifestyle  . Physical activity:    Days per week: Not on file    Minutes per session: Not on file  . Stress: Not on file  Relationships  . Social connections:    Talks on phone: Not on file    Gets together: Not on file    Attends religious service: Not on file    Active member of club or organization: Not on file    Attends meetings of clubs or organizations: Not on file    Relationship status: Not on file  Other Topics Concern  . Not on file  Social History Narrative  Bachelors Degree, Naval architect for [employer].    Married    Two daughters, one daughter on the way    He likes to sleep    Additional Social History:    Prescriptions: SAPHRIS, BUSPIRONE HCL, DULOXETINE DR, DIAZEPAM, ALLERGY MEDICINE (DULOXETINE DR NEW AS FO ABOUT 1 MONTH AGO) History of alcohol / drug use?: Yes Longest period of sobriety (when/how long): UNKNOWN Name of Substance 1: ALCOHOL 1 - Age of First Use: TEEN 1 - Amount (size/oz): MORE THAN 4 BEERS DAILY ON THE WEEKENDS 1 - Frequency: WEEKENDS ONLY 1 - Duration: ONGOING 1 - Last Use / Amount: LAST WEEKEND                  Sleep: Fair  Appetite:  Fair  Current Medications: Current Facility-Administered Medications  Medication Dose Route Frequency Provider Last Rate Last  Dose  . acetaminophen (TYLENOL) tablet 650 mg  650 mg Oral Q6H PRN Lindon Romp A, NP      . alum & mag hydroxide-simeth (MAALOX/MYLANTA) 200-200-20 MG/5ML suspension 30 mL  30 mL Oral Q4H PRN Lindon Romp A, NP      . asenapine (SAPHRIS) sublingual tablet 5 mg  5 mg Sublingual QHS Sharma Covert, MD      . busPIRone (BUSPAR) tablet 10 mg  10 mg Oral Daily Lindon Romp A, NP   10 mg at 01/09/18 0086  . DULoxetine (CYMBALTA) DR capsule 90 mg  90 mg Oral Daily Sharma Covert, MD   90 mg at 01/09/18 7619  . hydrOXYzine (ATARAX/VISTARIL) tablet 25 mg  25 mg Oral TID PRN Rozetta Nunnery, NP   25 mg at 01/08/18 2117  . lamoTRIgine (LAMICTAL) tablet 25 mg  25 mg Oral QHS Sharma Covert, MD   25 mg at 01/08/18 2153  . LORazepam (ATIVAN) tablet 1 mg  1 mg Oral Q6H PRN Sharma Covert, MD   1 mg at 01/08/18 1004  . magnesium hydroxide (MILK OF MAGNESIA) suspension 30 mL  30 mL Oral Daily PRN Rozetta Nunnery, NP      . traZODone (DESYREL) tablet 50 mg  50 mg Oral QHS,MR X 1 Lindon Romp A, NP   50 mg at 01/08/18 2117    Lab Results:  Results for orders placed or performed during the hospital encounter of 01/08/18 (from the past 48 hour(s))  CBC     Status: None   Collection Time: 01/08/18  6:23 AM  Result Value Ref Range   WBC 4.7 4.0 - 10.5 K/uL   RBC 4.36 4.22 - 5.81 MIL/uL   Hemoglobin 13.7 13.0 - 17.0 g/dL   HCT 40.4 39.0 - 52.0 %   MCV 92.7 78.0 - 100.0 fL   MCH 31.4 26.0 - 34.0 pg   MCHC 33.9 30.0 - 36.0 g/dL   RDW 13.1 11.5 - 15.5 %   Platelets 293 150 - 400 K/uL    Comment: Performed at [facility], [place] [address] [place], [place] [address]  Comprehensive metabolic panel     Status: None   Collection Time: 01/08/18  6:23 AM  Result Value Ref Range   Sodium 141 135 - 145 mmol/L   Potassium 3.7 3.5 - 5.1 mmol/L   Chloride 105 101 - 111 mmol/L   CO2 27 22 - 32 mmol/L   Glucose, Bld 90 65 - 99 mg/dL   BUN 10 6 - 20 mg/dL   Creatinine, Ser 1.00 0.61 -  1.24 mg/dL  Calcium 9.1 8.9 - 10.3 mg/dL   Total Protein 6.8 6.5 - 8.1 g/dL   Albumin 4.0 3.5 - 5.0 g/dL   AST 28 15 - 41 U/L   ALT 27 17 - 63 U/L   Alkaline Phosphatase 54 38 - 126 U/L   Total Bilirubin 0.4 0.3 - 1.2 mg/dL   GFR calc non Af Amer >60 >60 mL/min   GFR calc Af Amer >60 >60 mL/min    Comment: (NOTE) The eGFR has been calculated using the CKD EPI equation. This calculation has not been validated in all clinical situations. eGFR's persistently <60 mL/min signify possible Chronic Kidney Disease.    Anion gap 9 5 - 15    Comment: Performed at [facility], [place] [address], [place], [place] [address]  Hemoglobin A1c     Status: Abnormal   Collection Time: 01/08/18  6:23 AM  Result Value Ref Range   Hgb A1c MFr Bld 4.5 (L) 4.8 - 5.6 %    Comment: (NOTE) Pre diabetes:          5.7%-6.4% Diabetes:              >6.4% Glycemic control for   <7.0% adults with diabetes    Mean Plasma Glucose 82.45 mg/dL    Comment: Performed at [place] [address], [place], [place] [address]  Lipid panel     Status: None   Collection Time: 01/08/18  6:23 AM  Result Value Ref Range   Cholesterol 175 0 - 200 mg/dL   Triglycerides 62 <150 mg/dL   HDL 75 >40 mg/dL   Total CHOL/HDL Ratio 2.3 RATIO   VLDL 12 0 - 40 mg/dL   LDL Cholesterol 88 0 - 99 mg/dL    Comment:        Total Cholesterol/HDL:CHD Risk Coronary Heart Disease Risk Table                     Men   Women  1/2 Average Risk   3.4   3.3  Average Risk       5.0   4.4  2 X Average Risk   9.6   7.1  3 X Average Risk  23.4   11.0        Use the calculated Patient Ratio above and the CHD Risk Table to determine the patient's CHD Risk.        ATP III CLASSIFICATION (LDL):  <100     mg/dL   Optimal  100-129  mg/dL   Near or Above                    Optimal  130-159  mg/dL   Borderline  160-189  mg/dL   High  >190     mg/dL   Very High Performed at [facility]  [address], [place], [place] [address]   TSH     Status: None   Collection Time: 01/08/18  6:23 AM  Result Value Ref Range   TSH 2.288 0.350 - 4.500 uIU/mL    Comment: Performed by a 3rd Generation assay with a functional sensitivity of <=0.01 uIU/mL. Performed at [facility], [place] [address], [place], [place] [address]   Prolactin     Status: Abnormal   Collection Time: 01/08/18  6:23 AM  Result Value Ref Range   Prolactin 62.2 (H) 4.0 - 15.2 ng/mL    Comment: (NOTE) Performed At:  [facility] [place], [place] [id number] Rush Farmer MD [id number] Performed at [facility], [place] [address] [place],  [address]     Blood Alcohol level:  No results found for: [facility]  Metabolic Disorder Labs: Lab Results  Component Value Date   HGBA1C 4.5 (L) 01/08/2018   MPG 82.45 01/08/2018   Lab Results  Component Value Date   PROLACTIN 62.2 (H) 01/08/2018   Lab Results  Component Value Date   CHOL 175 01/08/2018   TRIG 62 01/08/2018   HDL 75 01/08/2018   CHOLHDL 2.3 01/08/2018   VLDL 12 01/08/2018   LDLCALC 88 01/08/2018   LDLCALC 96 11/14/2016    Physical Findings: AIMS: Facial and Oral Movements Muscles of Facial Expression: None, normal Lips and Perioral Area: None, normal Jaw: None, normal Tongue: None, normal,Extremity Movements Upper (arms, wrists, hands, fingers): None, normal Lower (legs, knees, ankles, toes): None, normal, Trunk Movements Neck, shoulders, hips: None, normal, Overall Severity Severity of abnormal movements (highest score from questions above): None, normal Incapacitation due to abnormal movements: None, normal Patient's awareness of abnormal movements (rate only patient's report): No Awareness, Dental Status Current problems with teeth and/or dentures?: No  CIWA:    COWS:     Musculoskeletal: Strength & Muscle Tone: within normal limits Gait & Station: normal Patient leans:  N/A  Psychiatric Specialty Exam: Physical Exam  Constitutional: He is oriented to person, place, and time. He appears well-developed and well-nourished.  Respiratory: Effort normal.  Musculoskeletal: Normal range of motion.  Neurological: He is alert and oriented to person, place, and time.    Review of Systems  Psychiatric/Behavioral: Positive for depression and suicidal ideas. The patient has insomnia.   All other systems reviewed and are negative.   Blood pressure (!) 128/91, pulse 79, temperature 97.6 F (36.4 C), temperature source Oral, resp. rate 18, height _0  (1.905 m), weight 106.1 kg (234 lb), SpO2 99 %.Body mass index is 29.25 kg/m.  General Appearance: Casual  Eye Contact:  Fair  Speech:  Slow  Volume:  Decreased  Mood:  Depressed  Affect:  Congruent  Thought Process:  Coherent  Orientation:  Full (Time, Place, and Person)  Thought Content:  Logical  Suicidal Thoughts:  No  Homicidal Thoughts:  No  Memory:  Immediate;   Fair  Judgement:  Intact  Insight:  Fair  Psychomotor Activity:  Psychomotor Retardation  Concentration:  Concentration: Fair  Recall:  [place] of Knowledge:  Good  Language:  Good  Akathisia:  No  Handed:  Right  AIMS (if indicated):     Assets:  Desire for Improvement Physical Health Resilience Vocational/Educational  ADL's:  Intact  Cognition:  WNL  Sleep:  Number of Hours: 6.75     Treatment Plan Summary: Daily contact with patient to assess and evaluate symptoms and progress in treatment, Medication management and Plan Patient is seen and examined.  Patient is a 42 year old male with the above-stated past psychiatric history seen in follow-up.  He is probably 10% better than yesterday.  He still having some sleeping difficulties.  He still depressed.  He denied any suicidal ideation currently, but he still significantly depressed.  Review of the information from his wife still has him denying any symptoms consistent with bipolar  disorder.  He was started on the Lamictal yesterday, and we will continue to titrate that.  He received a Saphris this morning, and has been taking it at night at home.  Will  switch that.  That will most likely help with sleep.  We discussed issues around the separation from his spouse, parenting and separation, and also his previous psychiatric treatment.  No other changes today.  He will continue on 15-minute checks, and hopefully start to improve more significantly.  Sharma Covert, MD 01/09/2018, 2:06 PM

## 2018-01-09 NOTE — Progress Notes (Signed)
Adult Psychoeducational Group Note  Date:  01/09/2018 Time:  9:57 AM  Group Topic/Focus:  Goals Group:   The focus of this group is to help patients establish daily goals to achieve during treatment and discuss how the patient can incorporate goal setting into their daily lives to aide in recovery.  Participation Level:  Active  Participation Quality:  Appropriate  Affect:  Appropriate  Cognitive:  Alert  Insight: Good  Engagement in Group:  Engaged  Modes of Intervention:  Rapport Building  Additional Comments: Pt was active and engage in group.  Aaron CongressWollie, Aaron [name]  01/09/2018, 9:57 AM

## 2018-01-09 NOTE — Progress Notes (Signed)
Adult Psychoeducational Group Note  Date:  01/09/2018 Time:  4:40 AM  Group Topic/Focus:  Wrap-Up Group:   The focus of this group is to help patients review their daily goal of treatment and discuss progress on daily workbooks.  Participation Level:  Active  Participation Quality:  Appropriate  Affect:  Appropriate  Cognitive:  Appropriate  Insight: Appropriate  Engagement in Group:  Engaged  Modes of Intervention:  Discussion  Additional Comments:  Pt stated his goal for today was to take his treatment here serious. Pt stated he felt he accomplished his goal today because he was able to meet his doctor and social worker today. Pt rated his overall day a 4 out of 10. Pt stated tomorrow goal is to attend all groups held.   Felipa FurnaceChristopher  Shirleyann Montero 01/09/2018, 4:40 AM

## 2018-01-10 MED ORDER — LAMOTRIGINE 25 MG PO TABS
50.0000 mg | ORAL_TABLET | Freq: Every day | ORAL | Status: DC
  Administered 2018-01-10 – 2018-01-12 (×3): 50 mg via ORAL
  Filled 2018-01-10 (×5): qty 2

## 2018-01-10 NOTE — [facility] Group Notes (Signed)
[facility] Group Notes:  (Nursing)  Date:  01/10/2018  Time:  1:15 PM Type of Therapy:  Nurse Education  Participation Level:  Minimal  Participation Quality:  Appropriate  Affect:  Appropriate  Cognitive:  Alert and Appropriate  Insight:  Appropriate  Engagement in Group:  Engaged  Modes of Intervention:  Discussion and Education  Summary of Progress/Problems: Patient was quiet, but remained attentive during group.  Shela NevinValerie S Jalila Goodnough 01/10/2018, 2:58 PM

## 2018-01-10 NOTE — Progress Notes (Signed)
Adult Psychoeducational Group Note  Date:  01/10/2018 Time:  9:04 PM  Group Topic/Focus:  Wrap-Up Group:   The focus of this group is to help patients review their daily goal of treatment and discuss progress on daily workbooks.  Participation Level:  Active  Participation Quality:  Appropriate  Affect:  Appropriate  Cognitive:  Alert and Oriented  Insight: Good  Engagement in Group:  Engaged  Modes of Intervention:  Discussion  Additional Comments:  Pt attended group this evening.   Leo GrosserMegan A Kilan Banfill 01/10/2018, 9:04 PM

## 2018-01-10 NOTE — Progress Notes (Signed)
D:  Patient's self inventory sheet, patient sleeps fair, sleep medication helpful.  Good appetite, normal energy level, good concentration.  Rated depression and anxiety 2, depression 9.  Denied withdrawals.  Denied SI.  Denied physical problems.  Denied physical pain.  Goal is discharge plan.  Plans to talk to staff.  No discharge plans at this time. A:  Medications administered per MD orders.  Emotional support and encouragement given patient. R:  Patient denied SI and HI, contracts for safety.  Denied A/V hallucinations.  Safety maintained with 15 minute checks.

## 2018-01-10 NOTE — [facility] Group Notes (Signed)
[facility]  Date/Time:  01/10/2018 10AM  Type of Therapy and Topic:  Group Therapy:  Healthy and Unhealthy Supports  Participation Level:  Active   Description of Group:  Patients in this group were introduced to the idea of adding a variety of healthy supports to address the various needs in their lives.Patients discussed what additional healthy supports could be helpful in their recovery and wellness after discharge in order to prevent future hospitalizations.   An emphasis was placed on using counselor, doctor, therapy groups, 12-step groups, and problem-specific support groups to expand supports.  They also worked as a group on developing a specific plan for several patients to deal with unhealthy supports through boundary-setting, psychoeducation with loved ones, and even termination of relationships.   Therapeutic Goals:              1)  discuss importance of adding supports to stay well once out of the hospital             2)  compare healthy versus unhealthy supports and identify some examples of each             3)  generate ideas and descriptions of healthy supports that can be added             4)  offer mutual support about how to address unhealthy supports             5)  encourage active participation in and adherence to discharge plan               Summary of Patient Progress:   Pt continues to work towards their tx goals but has not yet reached them. Pt was able to appropriately participate in group discussion, and was able to offer support/validation to other group members. Pt reported, "I'm feeling pretty good. I'm working on not isolating myself as much." Pt reported, "I don't really have any good supports right now because I've been isolating myself so much." Pt stated he plans to, "attend some groups to try and find supports out there." Pt stated he feels, "I've had a lot of unhealthy supports, people who take more than they give. I need to start saying no and  really trying to set better boundaries."   Therapeutic Modalities:   Motivational Interviewing Brief Solution-Focused Therapy  Heidi DachKelsey Zetta Stoneman, MSW, LCSW 01/10/2018 11:03 AM

## 2018-01-10 NOTE — Progress Notes (Signed)
Adult Psychoeducational Group Note  Date:  01/10/2018 Time:  2:37 AM  Group Topic/Focus:  Wrap-Up Group:   The focus of this group is to help patients review their daily goal of treatment and discuss progress on daily workbooks.  Participation Level:  Active  Participation Quality:  Appropriate  Affect:  Appropriate  Cognitive:  Appropriate  Insight: Improving  Engagement in Group:  Engaged  Modes of Intervention:  Discussion  Additional Comments:  Pt stated his goal for was to attend all groups and program. Pt stated he accomplished this goal today. Pt rated his overall day a 7 out of 10. Pt stated he attend all groups held today. Pt stated that playing basketball with his peers help improve his day.  Felipa FurnaceChristopher  Gage [name] 01/10/2018, 2:37 AM

## 2018-01-10 NOTE — Plan of Care (Signed)
Nurse discussed anxiety, depression, coping skills with patient. 

## 2018-01-10 NOTE — Progress Notes (Signed)
[facility] MD Progress Note  01/10/2018 3:10 PM Aaron [name]  MRN:  [id number] Subjective: Patient is seen and examined.  Patient is a 42 year old male with a past psychiatric history significant for severe recurrent major depression without psychotic features versus bipolar depression.  He seen in follow-up.  He has had a relatively decent day.  He still depressed, but he stated that there were no suicidal thoughts today.  He is very concerned after discharge about his life.  He we were discussing partial hospital versus intensive outpatient programs.  He continues to be somewhat dysphoric.  He denied any side effects to his medications.  We discussed the possibility of increasing his Lamictal. Principal Problem: <principal problem not specified> Diagnosis:   Patient Active Problem List   Diagnosis Date Noted  . Severe recurrent major depression without psychotic features (HCC) [F33.2] 01/08/2018   Total Time spent with patient: 20 minutes  Past Psychiatric History: See H&P  Past Medical History:  Past Medical History:  Diagnosis Date  . Allergy   . Chicken pox   . Depression    History reviewed. No pertinent surgical history. Family History:  Family History  Problem Relation Age of Onset  . Heart disease Father   . Depression Maternal Grandmother        commited suicide   Family Psychiatric  History: See H&P Social History:  Social History   Substance and Sexual Activity  Alcohol Use Yes  . Alcohol/week: 0.0 oz   Comment: occassional     Social History   Substance and Sexual Activity  Drug Use No    Social History   Socioeconomic History  . Marital status: Married    Spouse name: Not on file  . Number of children: Not on file  . Years of education: Not on file  . Highest education level: Not on file  Occupational History  . Not on file  Social Needs  . Financial resource strain: Not on file  . Food insecurity:    Worry: Not on file    Inability: Not on file  .  Transportation needs:    Medical: Not on file    Non-medical: Not on file  Tobacco Use  . Smoking status: Never Smoker  . Smokeless tobacco: Never Used  Substance and Sexual Activity  . Alcohol use: Yes    Alcohol/week: 0.0 oz    Comment: occassional  . Drug use: No  . Sexual activity: Yes  Lifestyle  . Physical activity:    Days per week: Not on file    Minutes per session: Not on file  . Stress: Not on file  Relationships  . Social connections:    Talks on phone: Not on file    Gets together: Not on file    Attends religious service: Not on file    Active member of club or organization: Not on file    Attends meetings of clubs or organizations: Not on file    Relationship status: Not on file  Other Topics Concern  . Not on file  Social History Narrative   Bachelors Degree, Administrator, arts for [employer].    Married    Two daughters, one daughter on the way    He likes to sleep    Additional Social History:    Prescriptions: SAPHRIS, BUSPIRONE HCL, DULOXETINE DR, DIAZEPAM, ALLERGY MEDICINE (DULOXETINE DR NEW AS FO ABOUT 1 MONTH AGO) History of alcohol / drug use?: Yes Longest period of sobriety (when/how long): UNKNOWN Name of  Substance 1: ALCOHOL 1 - Age of First Use: TEEN 1 - Amount (size/oz): MORE THAN 4 BEERS DAILY ON THE WEEKENDS 1 - Frequency: WEEKENDS ONLY 1 - Duration: ONGOING 1 - Last Use / Amount: LAST WEEKEND                  Sleep: Fair  Appetite:  Fair  Current Medications: Current Facility-Administered Medications  Medication Dose Route Frequency Provider Last Rate Last Dose  . acetaminophen (TYLENOL) tablet 650 mg  650 mg Oral Q6H PRN Nira ConnBerry, Jason A, NP      . alum & mag hydroxide-simeth (MAALOX/MYLANTA) 200-200-20 MG/5ML suspension 30 mL  30 mL Oral Q4H PRN Nira ConnBerry, Jason A, NP      . asenapine (SAPHRIS) sublingual tablet 5 mg  5 mg Sublingual QHS Antonieta Pertlary, Alarik Radu Lawson, MD   5 mg at 01/09/18 2139  . busPIRone (BUSPAR) tablet 10 mg   10 mg Oral Daily Nira ConnBerry, Jason A, NP   10 mg at 01/10/18 0750  . DULoxetine (CYMBALTA) DR capsule 90 mg  90 mg Oral Daily Antonieta Pertlary, Jaquelinne Glendening Lawson, MD   90 mg at 01/10/18 0750  . hydrOXYzine (ATARAX/VISTARIL) tablet 25 mg  25 mg Oral TID PRN Nira ConnBerry, Jason A, NP   25 mg at 01/08/18 2117  . lamoTRIgine (LAMICTAL) tablet 25 mg  25 mg Oral QHS Antonieta Pertlary, Makayla Lanter Lawson, MD   25 mg at 01/09/18 2139  . LORazepam (ATIVAN) tablet 1 mg  1 mg Oral Q6H PRN Antonieta Pertlary, Mickey Esguerra Lawson, MD   1 mg at 01/08/18 1004  . magnesium hydroxide (MILK OF MAGNESIA) suspension 30 mL  30 mL Oral Daily PRN Nira ConnBerry, Jason A, NP      . traZODone (DESYREL) tablet 50 mg  50 mg Oral QHS,MR X 1 Nira ConnBerry, Jason A, NP   50 mg at 01/09/18 2139    Lab Results: No results found for this or any previous visit (from the past 48 hour(s)).  Blood Alcohol level:  No results found for: [facility]  Metabolic Disorder Labs: Lab Results  Component Value Date   HGBA1C 4.5 (L) 01/08/2018   MPG 82.45 01/08/2018   Lab Results  Component Value Date   PROLACTIN 62.2 (H) 01/08/2018   Lab Results  Component Value Date   CHOL 175 01/08/2018   TRIG 62 01/08/2018   HDL 75 01/08/2018   CHOLHDL 2.3 01/08/2018   VLDL 12 01/08/2018   LDLCALC 88 01/08/2018   LDLCALC 96 11/14/2016    Physical Findings: AIMS: Facial and Oral Movements Muscles of Facial Expression: None, normal Lips and Perioral Area: None, normal Jaw: None, normal Tongue: None, normal,Extremity Movements Upper (arms, wrists, hands, fingers): None, normal Lower (legs, knees, ankles, toes): None, normal, Trunk Movements Neck, shoulders, hips: None, normal, Overall Severity Severity of abnormal movements (highest score from questions above): None, normal Incapacitation due to abnormal movements: None, normal Patient's awareness of abnormal movements (rate only patient's report): No Awareness, Dental Status Current problems with teeth and/or dentures?: No Does patient usually wear dentures?: No  CIWA:   CIWA-Ar Total: 1 COWS:  COWS Total Score: 1  Musculoskeletal: Strength & Muscle Tone: within normal limits Gait & Station: normal Patient leans: N/A  Psychiatric Specialty Exam: Physical Exam  Nursing note and vitals reviewed. Constitutional: He is oriented to person, place, and time. He appears well-developed and well-nourished.  HENT:  Head: Normocephalic and atraumatic.  Respiratory: Effort normal.  Neurological: He is alert and oriented to person, place, and time.  Review of Systems  Psychiatric/Behavioral: Positive for depression. The patient is nervous/anxious.   All other systems reviewed and are negative.   Blood pressure 126/85, pulse 75, temperature (!) 97.5 F (36.4 C), resp. rate 18, height 6\' 3"  (1.905 m), weight 106.1 kg (234 lb), SpO2 99 %.Body mass index is 29.25 kg/m.  General Appearance: Casual  Eye Contact:  Fair  Speech:  Normal Rate  Volume:  Decreased  Mood:  Depressed  Affect:  Congruent  Thought Process:  Coherent  Orientation:  Full (Time, Place, and Person)  Thought Content:  Logical  Suicidal Thoughts:  No  Homicidal Thoughts:  No  Memory:  Immediate;   Good  Judgement:  Fair  Insight:  Fair  Psychomotor Activity:  Decreased  Concentration:  Concentration: Fair  Recall:  Good  Fund of Knowledge:  Good  Language:  Good  Akathisia:  No  Handed:  Right  AIMS (if indicated):     Assets:  Desire for Improvement Housing Physical Health Resilience Talents/Skills Vocational/Educational  ADL's:  Intact  Cognition:  WNL  Sleep:  Number of Hours: 6.75     Treatment Plan Summary: Daily contact with patient to assess and evaluate symptoms and progress in treatment, Medication management and Plan Patient is seen and examined.  Patient is a 42 year old male with the above-stated past psychiatric history seen in follow-up.  He continues to be depressed to a certain degree as well as dysphoric.  I am going to go on and increase his Lamictal up to  50 mg p.o. nightly.  I would continue the rest of his medications as is.  We discussed the possibility of intensive outpatient programs versus partial hospital programs after he leaves the hospital.  We will talk to social work tomorrow and find out more about that.  Antonieta Pert, MD 01/10/2018, 3:10 PM

## 2018-01-11 NOTE — Progress Notes (Signed)
Adult Psychoeducational Group Note  Date:  01/11/2018 Time:  7:14 PM  Group Topic/Focus:  Goals Group:   The focus of this group is to help patients establish daily goals to achieve during treatment and discuss how the patient can incorporate goal setting into their daily lives to aide in recovery.  Participation Level:  Active  Participation Quality:  Appropriate  Affect:  Appropriate  Cognitive:  Appropriate  Insight: Appropriate  Engagement in Group:  Engaged  Modes of Intervention:  Discussion  Additional Comments:  Pt attended group and participated in group discussions.  Terris Bodin R Kaiyu Mirabal 01/11/2018, 7:14 PM

## 2018-01-11 NOTE — [facility] Group Notes (Signed)
[facility] LCSW Group Therapy Note  Date/Time: 01/11/18, 1315  Type of Therapy and Topic:  Group Therapy:  Overcoming Obstacles  Participation Level:  active  Description of Group:    In this group patients will be encouraged to explore what they see as obstacles to their own wellness and recovery. They will be guided to discuss their thoughts, feelings, and behaviors related to these obstacles. The group will process together ways to cope with barriers, with attention given to specific choices patients can make. Each patient will be challenged to identify changes they are motivated to make in order to overcome their obstacles. This group will be process-oriented, with patients participating in exploration of their own experiences as well as giving and receiving support and challenge from other group members.  Therapeutic Goals: 1. Patient will identify personal and current obstacles as they relate to admission. 2. Patient will identify barriers that currently interfere with their wellness or overcoming obstacles.  3. Patient will identify feelings, thought process and behaviors related to these barriers. 4. Patient will identify two changes they are willing to make to overcome these obstacles:    Summary of Patient Progress: Pt shared that mental health, happiness, and relationships are obstacles in his life.  Pt active during group discussion regarding ways to work to overcome obstacles in life.        Therapeutic Modalities:   Cognitive Behavioral Therapy Solution Focused Therapy Motivational Interviewing Relapse Prevention Therapy  Daleen SquibbGreg Ilsa Bonello, LCSW

## 2018-01-11 NOTE — Progress Notes (Signed)
[facility] MD Progress Note  01/11/2018 1:38 PM  [place]  MRN:  [id number] Subjective: Patient is seen and examined.  Patient is a 42 year old male with a history of depression versus bipolar disorder; depression.  He seen in follow-up.  He stated he has been more active today.  He got outside last night, and went to the gym this morning.  He is been interacting with other patients and staff more frequently.  He stated he has not had any suicidal thoughts this morning.  He met with social work, and his evaluation for an intensive outpatient program is reportedly scheduled for tomorrow afternoon.  He stated he had a meeting with his wife last night, and the visit was both good and bad.  We discussed the reality of the separation from his wife, and even though he will return home in the short run after the hospitalization, did not put false expectations on the relationship.  He denied any direct suicidal ideation today.  He still does appear to be moderately depressed.  He denied any side effects of the increase in his Lamictal dose. Principal Problem: <principal problem not specified> Diagnosis:   Patient Active Problem List   Diagnosis Date Noted  . Severe recurrent major depression without psychotic features ([place]) [F33.2] 01/08/2018   Total Time spent with patient: 20 minutes   Past Psychiatric History: See admission H&P  Past Medical History:  Past Medical History:  Diagnosis Date  . Allergy   . Chicken pox   . Depression    History reviewed. No pertinent surgical history. Family History:  Family History  Problem Relation Age of Onset  . Heart disease Father   . Depression Maternal Grandmother        commited suicide   Family Psychiatric  History: See admission H&P Social History:  Social History   Substance and Sexual Activity  Alcohol Use Yes  . Alcohol/week: 0.0 oz   Comment: occassional     Social History   Substance and Sexual Activity  Drug Use No    Social History    Socioeconomic History  . Marital status: Married    Spouse name: Not on file  . Number of children: Not on file  . Years of education: Not on file  . Highest education level: Not on file  Occupational History  . Not on file  Social Needs  . Financial resource strain: Not on file  . Food insecurity:    Worry: Not on file    Inability: Not on file  . Transportation needs:    Medical: Not on file    Non-medical: Not on file  Tobacco Use  . Smoking status: Never Smoker  . Smokeless tobacco: Never Used  Substance and Sexual Activity  . Alcohol use: Yes    Alcohol/week: 0.0 oz    Comment: occassional  . Drug use: No  . Sexual activity: Yes  Lifestyle  . Physical activity:    Days per week: Not on file    Minutes per session: Not on file  . Stress: Not on file  Relationships  . Social connections:    Talks on phone: Not on file    Gets together: Not on file    Attends religious service: Not on file    Active member of club or organization: Not on file    Attends meetings of clubs or organizations: Not on file    Relationship status: Not on file  Other Topics Concern  . Not on file  Social History Narrative   Bachelors Estate agent, Naval architect for [employer].    Married    Two daughters, one daughter on the way    He likes to sleep    Additional Social History:    Prescriptions: SAPHRIS, BUSPIRONE HCL, DULOXETINE DR, DIAZEPAM, ALLERGY MEDICINE (DULOXETINE DR NEW AS FO ABOUT 1 MONTH AGO) History of alcohol / drug use?: Yes Longest period of sobriety (when/how long): UNKNOWN Name of Substance 1: ALCOHOL 1 - Age of First Use: TEEN 1 - Amount (size/oz): MORE THAN 4 BEERS DAILY ON THE WEEKENDS 1 - Frequency: WEEKENDS ONLY 1 - Duration: ONGOING 1 - Last Use / Amount: LAST WEEKEND                  Sleep: Fair  Appetite:  Fair  Current Medications: Current Facility-Administered Medications  Medication Dose Route Frequency Provider Last Rate Last  Dose  . acetaminophen (TYLENOL) tablet 650 mg  650 mg Oral Q6H PRN Lindon Romp A, NP      . alum & mag hydroxide-simeth (MAALOX/MYLANTA) 200-200-20 MG/5ML suspension 30 mL  30 mL Oral Q4H PRN Lindon Romp A, NP      . asenapine (SAPHRIS) sublingual tablet 5 mg  5 mg Sublingual QHS Sharma Covert, MD   5 mg at 01/10/18 2232  . busPIRone (BUSPAR) tablet 10 mg  10 mg Oral Daily Lindon Romp A, NP   10 mg at 01/11/18 0818  . DULoxetine (CYMBALTA) DR capsule 90 mg  90 mg Oral Daily Sharma Covert, MD   90 mg at 01/11/18 0818  . hydrOXYzine (ATARAX/VISTARIL) tablet 25 mg  25 mg Oral TID PRN Rozetta Nunnery, NP   25 mg at 01/08/18 2117  . lamoTRIgine (LAMICTAL) tablet 50 mg  50 mg Oral QHS Sharma Covert, MD   50 mg at 01/10/18 2232  . LORazepam (ATIVAN) tablet 1 mg  1 mg Oral Q6H PRN Sharma Covert, MD   1 mg at 01/08/18 1004  . magnesium hydroxide (MILK OF MAGNESIA) suspension 30 mL  30 mL Oral Daily PRN Lindon Romp A, NP      . traZODone (DESYREL) tablet 50 mg  50 mg Oral QHS,MR X 1 Lindon Romp A, NP   50 mg at 01/10/18 2232    Lab Results: No results found for this or any previous visit (from the past 49 hour(s)).  Blood Alcohol level:  No results found for: [facility]  Metabolic Disorder Labs: Lab Results  Component Value Date   HGBA1C 4.5 (L) 01/08/2018   MPG 82.45 01/08/2018   Lab Results  Component Value Date   PROLACTIN 62.2 (H) 01/08/2018   Lab Results  Component Value Date   CHOL 175 01/08/2018   TRIG 62 01/08/2018   HDL 75 01/08/2018   CHOLHDL 2.3 01/08/2018   VLDL 12 01/08/2018   LDLCALC 88 01/08/2018   LDLCALC 96 11/14/2016    Physical Findings: AIMS: Facial and Oral Movements Muscles of Facial Expression: None, normal Lips and Perioral Area: None, normal Jaw: None, normal Tongue: None, normal,Extremity Movements Upper (arms, wrists, hands, fingers): None, normal Lower (legs, knees, ankles, toes): None, normal, Trunk Movements Neck, shoulders, hips:  None, normal, Overall Severity Severity of abnormal movements (highest score from questions above): None, normal Incapacitation due to abnormal movements: None, normal Patient's awareness of abnormal movements (rate only patient's report): No Awareness, Dental Status Current problems with teeth and/or dentures?: No Does patient usually wear dentures?: No  CIWA:  CIWA-Ar Total: 1 COWS:  COWS Total Score: 1  Musculoskeletal: Strength & Muscle Tone: within normal limits Gait & Station: normal Patient leans: N/A  Psychiatric Specialty Exam: Physical Exam  Constitutional: He is oriented to person, place, and time. He appears well-developed and well-nourished.  HENT:  Head: Normocephalic and atraumatic.  Respiratory: Effort normal.  Musculoskeletal: Normal range of motion.  Neurological: He is alert and oriented to person, place, and time.    ROS  Blood pressure 140/87, pulse 74, temperature (!) 97.5 F (36.4 C), resp. rate 18, height _0  (1.905 m), weight 106.1 kg (234 lb), SpO2 99 %.Body mass index is 29.25 kg/m.  General Appearance: Fairly Groomed  Eye Contact:  Fair  Speech:  Slow  Volume:  Decreased  Mood:  Depressed  Affect:  Congruent  Thought Process:  Coherent  Orientation:  Full (Time, Place, and Person)  Thought Content:  Logical  Suicidal Thoughts:  No  Homicidal Thoughts:  No  Memory:  Immediate;   Good  Judgement:  Fair  Insight:  Fair  Psychomotor Activity:  Psychomotor Retardation  Concentration:  Concentration: Good  Recall:  Good  Fund of Knowledge:  Good  Language:  Good  Akathisia:  No  Handed:  Right  AIMS (if indicated):     Assets:  Desire for Improvement Financial Resources/Insurance Physical Health Transportation Vocational/Educational  ADL's:  Intact  Cognition:  WNL  Sleep:  Number of Hours: 6.5     Treatment Plan Summary: Daily contact with patient to assess and evaluate symptoms and progress in treatment, Medication management and  Plan Patient is seen and examined.  Patient is a 42 year old male with the above-stated past psychiatric history seen in follow-up.  He continues to slowly improve, but is still depressed.  We are trying to arrange for an intensive outpatient program for him.  The interview for that is tomorrow afternoon.  He continues on the Cymbalta which was just increased 2 days ago, and his Lamictal was increased to 50 mg p.o. nightly yesterday.  He denied any side effects of his current medications.  He stated his suicidal thoughts are decreasing.  Hopefully he will continue to improve during the course of the hospitalization.  No changes in his medications today, will continue 15-minute checks, as well as the psychosocial interventions from the groups.  Sharma Covert, MD 01/11/2018, 1:38 PM

## 2018-01-11 NOTE — Progress Notes (Signed)
Recreation Therapy Notes  Date: 4.1.19 Time: 9:30 a.m. Location: [address] Dayroom   Group Topic: Stress Management   Goal Area(s) Addresses:  Goal 1.1: To reduce stress  -Patient will report feeling a reduction in stress level  -Patient will identify the importance of stress management  -Patient will participate during stress management group treatment     Behavioral Response: Engaged   Intervention: Stress Management   Activity: Guided Imagery- Patients were in a peaceful environment with soft lighting enhancing patients mood. Patients were read a guided imagery script to help decrease stress levels   Education: Stress Management, Discharge Planning.    Education Outcome: Acknowledges edcuation/In group clarification offered/Needs additional education   Clinical Observations/Feedback:: Patient attended and participated appropriately during stress management group treatment. Patient reported feeling a reduction in stress level.    Malvin Morrish, Recreation Therapy Intern   [place] [facility] 01/11/2018 9:15 AM 

## 2018-01-11 NOTE — Progress Notes (Signed)
Pt presents with a flat affect and depressed mood. Pt reported decreased symptoms of depression and anxiety today. Pt rates depression 2/10 and Anxiety 2/10. Pt denies any fleeting SI today. Pt reported good sleep last night. Pt stated goal for today is to work on discharge plans. Pt stated that he would like to speak with CSW about the IOP program. Treatment team made aware of pt request. Pt reported that he's tolerating his meds well. No side effects to meds verbalized by pt. Medications reviewed with pt. Verbal support provided. Pt encouraged to attend groups. 15 minute checks performed for safety. Pt compliant with tx plan.

## 2018-01-11 NOTE — Progress Notes (Signed)
Adult Psychoeducational Group Note  Date:  01/11/2018 Time:  9:41 PM  Group Topic/Focus:  Wrap-Up Group:   The focus of this group is to help patients review their daily goal of treatment and discuss progress on daily workbooks.  Participation Level:  Active  Participation Quality:  Appropriate  Affect:  Appropriate  Cognitive:  Alert  Insight: Appropriate  Engagement in Group:  Engaged  Modes of Intervention:  Discussion  Additional Comments:  Patient stated having a stressful day. Patient's goal for today was to outline his discharge plan. Patient stated he will be meeting with the doctor and his family tomorrow to discuss the discharge plan.   Kally Cadden L Aysia Lowder 01/11/2018, 9:41 PM

## 2018-01-11 NOTE — [facility] Group Notes (Signed)
[facility] Group Notes:  (Nursing/MHT/Case Management/Adjunct)  Date:  01/11/2018  Time:  4:00 pm  Type of Therapy:  Psychoeducational Skills  Participation Level:  Active  Participation Quality:  Appropriate  Affect:  Appropriate  Cognitive:  Appropriate  Insight:  Appropriate  Engagement in Group:  Engaged  Modes of Intervention:  Education  Summary of Progress/Problems:   Patient was alert and participated in group.  Earline MayotteKnight, Elaisha Zahniser Shephard 01/11/2018, 6:19 PM

## 2018-01-12 DIAGNOSIS — G4700 Insomnia, unspecified: Secondary | ICD-10-CM

## 2018-01-12 MED ORDER — BUSPIRONE HCL 10 MG PO TABS
10.0000 mg | ORAL_TABLET | Freq: Two times a day (BID) | ORAL | Status: DC
  Administered 2018-01-12 – 2018-01-13 (×2): 10 mg via ORAL
  Filled 2018-01-12 (×4): qty 1

## 2018-01-12 NOTE — Plan of Care (Signed)
  Problem: Education: Goal: Emotional status will improve Outcome: Progressing   Problem: Education: Goal: Verbalization of understanding the information provided will improve Outcome: Progressing   Problem: Activity: Goal: Interest or engagement in activities will improve Outcome: Progressing Goal: Sleeping patterns will improve Outcome: Progressing   Problem: Coping: Goal: Ability to demonstrate self-control will improve Outcome: Progressing   Problem: Safety: Goal: Periods of time without injury will increase Outcome: Progressing   Problem: Coping: Goal: Coping ability will improve Outcome: Progressing

## 2018-01-12 NOTE — Progress Notes (Signed)
Adult Psychoeducational Group Note  Date:  01/12/2018 Time:  10:06 PM  Group Topic/Focus:  Wrap-Up Group:   The focus of this group is to help patients review their daily goal of treatment and discuss progress on daily workbooks.  Participation Level:  Active  Participation Quality:  Appropriate  Affect:  Appropriate  Cognitive:  Appropriate  Insight: Appropriate  Engagement in Group:  Engaged  Modes of Intervention:  Discussion  Additional Comments:  Patient attended group and said that his day was a 5. His coping skills were exercising, deep breathing, and watching tv    Sonny DandyDelicia W Dominion Kathan 01/12/2018, 10:06 PM

## 2018-01-12 NOTE — Progress Notes (Signed)
D:  Peyton NajjarDamian has been visible on the unit.  Attending groups.  Interacting with peers.  He denies SI/HI or A/V hallucinations.  Self inventory completed and reports depression 2/10, hopelessness 4/10 and anxiety 2/10.  Goal for today is "to have a good meeting with my wife" and he will accomplish this goal by "stay calm and use some coping skills like breathing."  He denies any pain and appears to be in no physical distress.  He did take vistaril after his meeting with because he was a little anxious.   A:  1:1 with RN for support and encouragement.  Medications as ordered.  Q 15 minute checks maintained for safety.  Encouraged participation in group and unit activities. R:  Peyton NajjarDamian remains safe on the unit.  We will continue to monitor the progress towards his goals.

## 2018-01-12 NOTE — Progress Notes (Signed)
D: Pt denies SI/HI/AVH. Pt is pleasant and cooperative. Pt stated he was doing ok , ready to leave.   A: Pt was offered support and encouragement. Pt was given scheduled medications. Pt was encourage to attend groups. Q 15 minute checks were done for safety.   R:Pt attends groups and interacts well with peers and staff. Pt is taking medication. Pt has no complaints.Pt receptive to treatment and safety maintained on unit.

## 2018-01-12 NOTE — [facility] Group Notes (Signed)
Adult Psychoeducational Group Note  Date:  01/12/2018 Time:  9:43 AM  Group Topic/Focus:  Goals Group:   The focus of this group is to help patients establish daily goals to achieve during treatment and discuss how the patient can incorporate goal setting into their daily lives to aide in recovery.  Participation Level:  Active  Participation Quality:  Appropriate  Affect:  Appropriate  Cognitive:  Appropriate  Insight: Appropriate  Engagement in Group:  Engaged  Modes of Intervention:  Orientation  Additional Comments:  Pt participated in orientation/goals group. Pt goal for today is to have a successful family session.   Aaron [name] 01/12/2018, 9:43 AM

## 2018-01-12 NOTE — Progress Notes (Signed)
Aaron [name] [facility] MD Progress Note  01/12/2018 1:20 PM Aaron [name]  MRN:  [id number] Subjective: Patient reports he is feeling better.  As he improves he is focusing more on discharge/disposition and is hopeful to be discharged soon.  Currently he is not endorsing medication side effects.  Denies suicidal ideations. Objective : I have discussed case with treatment team and have met with patient. He is a 42 year old married male who has a history of depression and prior psychiatric admissions due to depression.  Of note wife has reported that he has been diagnosed with bipolar disorder in the past.  Patient was admitted to the unit on March 28, due to suicidal ideations, worsening depression.  He has been facing significant stressors particularly marital conflict.  Wife had expressed request for separation prior to admission. At this time patient presents vaguely anxious and depressed but states that he is feeling better, denies suicidal ideations, is future oriented and hopeful for discharge soon. He had a family session with his wife, himself, social worker earlier today which he states went well. He is visible on the unit and has been going to groups.  Behavior on unit in good control. Currently on Saphris, BuSpar, Cymbalta (dose recently increased), Lamictal.  Denies side effects.  Side effects discussed.  No akathisia or abnormal movements noted or reported.   Principal Problem: MDD  Diagnosis:   Patient Active Problem List   Diagnosis Date Noted  . Severe recurrent major depression without psychotic features ([place]) [F33.2] 01/08/2018   Total Time spent with patient: 20 minutes   Past Psychiatric History: See admission H&P  Past Medical History:  Past Medical History:  Diagnosis Date  . Allergy   . Chicken pox   . Depression    History reviewed. No pertinent surgical history. Family History:  Family History  Problem Relation Age of Onset  . Heart disease Father   . Depression Maternal  Grandmother        commited suicide   Family Psychiatric  History: See admission H&P Social History:  Social History   Substance and Sexual Activity  Alcohol Use Yes  . Alcohol/week: 0.0 oz   Comment: occassional     Social History   Substance and Sexual Activity  Drug Use No    Social History   Socioeconomic History  . Marital status: Married    Spouse name: Not on file  . Number of children: Not on file  . Years of education: Not on file  . Highest education level: Not on file  Occupational History  . Not on file  Social Needs  . Financial resource strain: Not on file  . Food insecurity:    Worry: Not on file    Inability: Not on file  . Transportation needs:    Medical: Not on file    Non-medical: Not on file  Tobacco Use  . Smoking status: Never Smoker  . Smokeless tobacco: Never Used  Substance and Sexual Activity  . Alcohol use: Yes    Alcohol/week: 0.0 oz    Comment: occassional  . Drug use: No  . Sexual activity: Yes  Lifestyle  . Physical activity:    Days per week: Not on file    Minutes per session: Not on file  . Stress: Not on file  Relationships  . Social connections:    Talks on phone: Not on file    Gets together: Not on file    Attends religious service: Not on file    Active  member of club or organization: Not on file    Attends meetings of clubs or organizations: Not on file    Relationship status: Not on file  Other Topics Concern  . Not on file  Social History Narrative   Bachelors Degree, Naval architect for [employer].    Married    Two daughters, one daughter on the way    He likes to sleep    Additional Social History:    Prescriptions: SAPHRIS, BUSPIRONE HCL, DULOXETINE DR, DIAZEPAM, ALLERGY MEDICINE (DULOXETINE DR NEW AS FO ABOUT 1 MONTH AGO) History of alcohol / drug use?: Yes Longest period of sobriety (when/how long): UNKNOWN Name of Substance 1: ALCOHOL 1 - Age of First Use: TEEN 1 - Amount (size/oz):  MORE THAN 4 BEERS DAILY ON THE WEEKENDS 1 - Frequency: WEEKENDS ONLY 1 - Duration: ONGOING 1 - Last Use / Amount: LAST WEEKEND  Sleep: Improved  Appetite: Improving  Current Medications: Current Facility-Administered Medications  Medication Dose Route Frequency Provider Last Rate Last Dose  . acetaminophen (TYLENOL) tablet 650 mg  650 mg Oral Q6H PRN Lindon Romp A, NP      . alum & mag hydroxide-simeth (MAALOX/MYLANTA) 200-200-20 MG/5ML suspension 30 mL  30 mL Oral Q4H PRN Lindon Romp A, NP      . asenapine (SAPHRIS) sublingual tablet 5 mg  5 mg Sublingual QHS Sharma Covert, MD   5 mg at 01/11/18 2150  . busPIRone (BUSPAR) tablet 10 mg  10 mg Oral Daily Lindon Romp A, NP   10 mg at 01/12/18 0811  . DULoxetine (CYMBALTA) DR capsule 90 mg  90 mg Oral Daily Sharma Covert, MD   90 mg at 01/12/18 0626  . hydrOXYzine (ATARAX/VISTARIL) tablet 25 mg  25 mg Oral TID PRN Rozetta Nunnery, NP   25 mg at 01/12/18 1129  . lamoTRIgine (LAMICTAL) tablet 50 mg  50 mg Oral QHS Sharma Covert, MD   50 mg at 01/11/18 2149  . LORazepam (ATIVAN) tablet 1 mg  1 mg Oral Q6H PRN Sharma Covert, MD   1 mg at 01/08/18 1004  . magnesium hydroxide (MILK OF MAGNESIA) suspension 30 mL  30 mL Oral Daily PRN Lindon Romp A, NP      . traZODone (DESYREL) tablet 50 mg  50 mg Oral QHS,MR X 1 Lindon Romp A, NP   50 mg at 01/11/18 2150    Lab Results: No results found for this or any previous visit (from the past 48 hour(s)).  Blood Alcohol level:  No results found for: [facility]  Metabolic Disorder Labs: Lab Results  Component Value Date   HGBA1C 4.5 (L) 01/08/2018   MPG 82.45 01/08/2018   Lab Results  Component Value Date   PROLACTIN 62.2 (H) 01/08/2018   Lab Results  Component Value Date   CHOL 175 01/08/2018   TRIG 62 01/08/2018   HDL 75 01/08/2018   CHOLHDL 2.3 01/08/2018   VLDL 12 01/08/2018   LDLCALC 88 01/08/2018   LDLCALC 96 11/14/2016    Physical Findings: AIMS: Facial and Oral  Movements Muscles of Facial Expression: None, normal Lips and Perioral Area: None, normal Jaw: None, normal Tongue: None, normal,Extremity Movements Upper (arms, wrists, hands, fingers): None, normal Lower (legs, knees, ankles, toes): None, normal, Trunk Movements Neck, shoulders, hips: None, normal, Overall Severity Severity of abnormal movements (highest score from questions above): None, normal Incapacitation due to abnormal movements: None, normal Patient's awareness of abnormal movements (rate only patient's  report): No Awareness, Dental Status Current problems with teeth and/or dentures?: No Does patient usually wear dentures?: No  CIWA:  CIWA-Ar Total: 1 COWS:  COWS Total Score: 1  Musculoskeletal: Strength & Muscle Tone: within normal limits Gait & Station: normal Patient leans: N/A  Psychiatric Specialty Exam: Physical Exam  Constitutional: He is oriented to person, place, and time. He appears well-developed and well-nourished.  HENT:  Head: Normocephalic and atraumatic.  Respiratory: Effort normal.  Musculoskeletal: Normal range of motion.  Neurological: He is alert and oriented to person, place, and time.    ROS no chest pain, no dyspnea, no vomiting  Blood pressure 123/84, pulse 91, temperature 97.7 F (36.5 C), temperature source Oral, resp. rate 18, height _0  (1.905 m), weight 106.1 kg (234 lb), SpO2 99 %.Body mass index is 29.25 kg/m.  General Appearance: Improved grooming  Eye Contact:  Good  Speech:  Normal Rate  Volume:  Decreased  Mood:  Reports improving mood, presents vaguely depressed  Affect:  Affect is constricted but improved partially during session, smiles briefly at times  Thought Process:  Coherent, Linear and Descriptions of Associations: Intact  Orientation:  Full (Time, Place, and Person)  Thought Content:  No hallucinations, no delusions are expressed, does not appear internally preoccupied  Suicidal Thoughts:  No-denies suicidal  ideations, contracts for safety on unit, denies any violent or homicidal ideations  Homicidal Thoughts:  No  Memory:  Recent and remote grossly intact  Judgement: -improving  Insight:  Fair-improving  Psychomotor Activity:  Normal  Concentration:  Concentration: Good and Attention Span: Good  Recall:  Good  Fund of Knowledge:  Good  Language:  Good  Akathisia:  No  Handed:  Right  AIMS (if indicated):     Assets:  Desire for Improvement Financial Resources/Insurance Physical Health Transportation Vocational/Educational  ADL's:  Intact  Cognition:  WNL  Sleep:  Number of Hours: 6.75   Assessment patient is a 42 year old-male with a history of mood disorder/depression.  He presented due to worsening depression and suicidal ideations in the context of marital conflict.  Currently he is feeling better, currently minimizes neurovegetative symptoms of depression, denies suicidal ideations.  He does present with residual depressed/constricted affect.  As he improves he is focusing more on being discharged soon, states he is looking forward to returning to work and seeing his children.  Thus far tolerating medications well.  Treatment Plan Summary: Encourage group and milieu participation to work on coping skills and symptom reduction Treatment team working on disposition planning Increase BuSpar to 10 mg twice daily for anxiety Continue Saphris 5 mg sublingually nightly for mood disorder Continue Cymbalta 90 mg a day for depression and anxiety Continue trazodone 50 mg nightly as needed for insomnia as needed Continue Ativan 1 mg every 6 hours as needed for anxiety as needed   Jenne Campus, MD 01/12/2018, 1:20 PM   Patient ID: Aaron [name], male   DOB: [birth year], 42 y.o.   MRN: [id number]

## 2018-01-12 NOTE — Plan of Care (Signed)
  Problem: Education: Goal: Emotional status will improve Outcome: Progressing   Problem: Education: Goal: Mental status will improve Outcome: Progressing   Problem: Activity: Goal: Interest or engagement in activities will improve Outcome: Progressing   Problem: Coping: Goal: Ability to verbalize frustrations and anger appropriately will improve Outcome: Progressing   Problem: Safety: Goal: Periods of time without injury will increase Outcome: Progressing   Problem: Coping: Goal: Coping ability will improve Outcome: Progressing

## 2018-01-12 NOTE — Progress Notes (Signed)
Adult Services Patient-Family Contact/Session  Attendees:  CSW, pt, pt wife: Aaron [name]  Goal(s):  Discuss pt return to the home in light of recent request by wife for separation.  Safety Concerns:    Narrative:  Pt had questions about whether wife was open to continue to work on issues in the marriage or if she was asking for divorce.  Wife says she is calling this a separation, is willing to continue marriage counseling, and does not know the permanent outcome yet.  They have been in marital counseling for several years already.  They have a code word that either can use if tensions start to escalate and that word signifies the need to take a break and cool off.  This will continue to be in effect.  Wife asked for some clarification regarding finances and this was discussed, along with some issues regarding pt's children from a previous marriage and how that visitations schedule will resume.  We discussed the [facility]  Pt has some questions regarding a regular outpt therapist due to the current identified therapist being out of network for his insurance.    Barrier(s):    Interventions:    Recommendation(s):  IOP program for follow up, plan to reserve difficult conversations regarding the separation for future marital counseling sessions.  Follow-up Required:  No  Explanation:    Aaron [name], Aaron Arca Jon, LCSW 01/12/2018, 11:25 AM

## 2018-01-12 NOTE — BHH Group Notes (Signed)
LCSW Group Therapy Note 01/12/2018 2:46 PM  Type of Therapy/Topic: Group Therapy: Feelings about Diagnosis  Participation Level: Active   Description of Group:  This group will allow patients to explore their thoughts and feelings about diagnoses they have received. Patients will be guided to explore their level of understanding and acceptance of these diagnoses. Facilitator will encourage patients to process their thoughts and feelings about the reactions of others to their diagnosis and will guide patients in identifying ways to discuss their diagnosis with significant others in their lives. This group will be process-oriented, with patients participating in exploration of their own experiences, giving and receiving support, and processing challenge from other group members.  Therapeutic Goals: 1. Patient will demonstrate understanding of diagnosis as evidenced by identifying two or more symptoms of the disorder 2. Patient will be able to express two feelings regarding the diagnosis 3. Patient will demonstrate their ability to communicate their needs through discussion and/or role play  Summary of Patient Progress:  Aaron [name] was engaged and participated throughout the group session. He reports that he was not surprised when he received his diagnosis. Aaron [name] reports that his main obstacle is his diagnosis of depression. Aaron [name] states that moving forward he plans to put his happiness first and to continue to learn better coping skills that will help him move forward.     Therapeutic Modalities:  Cognitive Behavioral Therapy Brief Therapy Feelings Identification    Corryn Madewell Catalina AntiguaWilliams LCSWA Clinical Social Worker

## 2018-01-13 ENCOUNTER — Other Ambulatory Visit (HOSPITAL_COMMUNITY): Payer: Commercial Managed Care - PPO | Attending: Psychiatry | Admitting: Licensed Clinical Social Worker

## 2018-01-13 DIAGNOSIS — Z79899 Other long term (current) drug therapy: Secondary | ICD-10-CM | POA: Insufficient documentation

## 2018-01-13 DIAGNOSIS — R4589 Other symptoms and signs involving emotional state: Secondary | ICD-10-CM | POA: Insufficient documentation

## 2018-01-13 DIAGNOSIS — Z818 Family history of other mental and behavioral disorders: Secondary | ICD-10-CM | POA: Insufficient documentation

## 2018-01-13 DIAGNOSIS — F419 Anxiety disorder, unspecified: Secondary | ICD-10-CM | POA: Insufficient documentation

## 2018-01-13 DIAGNOSIS — F332 Major depressive disorder, recurrent severe without psychotic features: Secondary | ICD-10-CM | POA: Diagnosis not present

## 2018-01-13 DIAGNOSIS — Z8249 Family history of ischemic heart disease and other diseases of the circulatory system: Secondary | ICD-10-CM | POA: Insufficient documentation

## 2018-01-13 DIAGNOSIS — G4700 Insomnia, unspecified: Secondary | ICD-10-CM | POA: Insufficient documentation

## 2018-01-13 DIAGNOSIS — F0789 Other personality and behavioral disorders due to known physiological condition: Secondary | ICD-10-CM | POA: Insufficient documentation

## 2018-01-13 MED ORDER — LAMOTRIGINE 25 MG PO TABS: 50.0000 mg | ORAL_TABLET | Freq: Every day | ORAL | 0 refills | Status: DC

## 2018-01-13 MED ORDER — ASENAPINE MALEATE 5 MG SL SUBL: 5.0000 mg | SUBLINGUAL_TABLET | Freq: Every day | SUBLINGUAL | 0 refills | Status: DC

## 2018-01-13 MED ORDER — BUSPIRONE HCL 10 MG PO TABS: 10.0000 mg | ORAL_TABLET | Freq: Two times a day (BID) | ORAL | 0 refills | Status: DC

## 2018-01-13 MED ORDER — TRAZODONE HCL 50 MG PO TABS: 50.0000 mg | ORAL_TABLET | Freq: Every evening | ORAL | 0 refills | Status: DC | PRN

## 2018-01-13 MED ORDER — HYDROXYZINE HCL 25 MG PO TABS: 25.0000 mg | ORAL_TABLET | Freq: Three times a day (TID) | ORAL | 0 refills | Status: DC | PRN

## 2018-01-13 MED ORDER — DULOXETINE HCL 30 MG PO CPEP: 90.0000 mg | ORAL_CAPSULE | Freq: Every day | ORAL | 0 refills | Status: DC

## 2018-01-13 NOTE — Progress Notes (Signed)
Pt received both written and verbal discharge instructions. Pt verbalized understanding of discharge instructions. Pt agreed to f/u appt and med regimen. Pt received d/c packet and prescriptions. Pt safely d/c'd to the lobby.   

## 2018-01-13 NOTE — [facility] Suicide Risk Assessment (Addendum)
[facility] Discharge Suicide Risk Assessment   Principal Problem: Depression Discharge Diagnoses:  Patient Active Problem List   Diagnosis Date Noted  . Severe recurrent major depression without psychotic features ([facility]) [F33.2] 01/08/2018    Total Time spent with patient: 30 minutes  Musculoskeletal: Strength & Muscle Tone: within normal limits Gait & Station: normal Patient leans: N/A  Psychiatric Specialty Exam: ROS no headaches, no chest pain, no shortness of breath, no vomiting   Blood pressure 121/86, pulse (!) 110, temperature 97.8 F (36.6 C), temperature source Oral, resp. rate 20, height 6\' 3"  (1.905 m), weight 106.1 kg (234 lb), SpO2 99 %.Body mass index is 29.25 kg/m.  General Appearance: Well Groomed  Eye Contact::  Good  Speech:  Normal Rate409  Volume:  Normal  Mood:  reports mood as significantly improved compared to admission  Affect:  remains constricted, but is more reactive, smiles briefly at times   Thought Process:  Linear and Descriptions of Associations: Intact  Orientation:  Full (Time, Place, and Person)  Thought Content:  denies hallucinations, no delusions, not internally preoccupied   Suicidal Thoughts:  No denies suicidal or self injurious ideations, denies any self injurious ideations, denies any homicidal or violent ideations, and specifically also denies any homicidal or violent ideations towards wife or family   Homicidal Thoughts:  No  Memory:  recent and remote grossly intact   Judgement:  Other:  improving   Insight:  improving   Psychomotor Activity:  Normal  Concentration:  Good  Recall:  Good  Fund of Knowledge:Good  Language: Good  Akathisia:  Negative  Handed:  Right  AIMS (if indicated):   no abnormal or involuntary movements noted or reported   Assets:  Communication Skills Desire for Improvement Resilience  Sleep:  Number of Hours: 6.75  Cognition: WNL  ADL's:  Intact   Mental Status Per Nursing Assessment::   On Admission:   Self-harm thoughts  Demographic Factors:  42 year old male, married, employed   Loss Factors: Marital stressors, wife has requested separation, work related stressors, states that working second shift makes it harder for him to spend more time with family, children  Historical Factors: History of depression, mood disorder, prior psychiatric admission  Risk Reduction Factors:   Responsible for children under 12 years of age, Sense of responsibility to family, Employed, Living with another person, especially a relative and Positive coping skills or problem solving skills  Continued Clinical Symptoms:  At this time patient presents alert, attentive, well related , calm. Reports his mood is significantly improved , affect remains somewhat constricted , but does smile at times appropriately. No thought disorder, no suicidal or self injurious ideations, no homicidal or violent ideations, no hallucinations, no delusions, future oriented . Denies medication side effects. Behavior on unit calm and in good control. Motivated in further outpatient treatment- plans to go to IOP   Cognitive Features That Contribute To Risk:  No gross cognitive deficits noted upon discharge. Is alert , attentive, and oriented x 3   Suicide Risk:  Mild:  Suicidal ideation of limited frequency, intensity, duration, and specificity.  There are no identifiable plans, no associated intent, mild dysphoria and related symptoms, good self-control (both objective and subjective assessment), few other risk factors, and identifiable protective factors, including available and accessible social support.  Follow-up Information    Group, [facility]. Go on 01/18/2018.   Specialty:  Behavioral Health Why:  Medication management appointment with Dr.Jennings 01/18/18 at 9:00am. Contact information: [address] [place] [address] [phone]        [facility] [place]. Go on 01/14/2018.    Why:  Therapy appointment 01/14/18 at 11:00am. Contact information: [address]  [place], [place] [address] Phone: [phone]       [facility]. Go on 01/13/2018.   Specialty:  Behavioral Health Why:  Please attend your Intensive Outpatient Program intake appt on Wednesday, 01/13/18, at 1:30pm. Contact information: [address] [place] [address] [phone]          Plan Of Care/Follow-up recommendations:  Activity:  As tolerated  Diet:  regular Tests:  NA Other:  See below  Patient is requesting discharge and there are no current grounds for involuntary commitment Plans to return home, states wife will pick him up later today Has intake appointment today for Intensive Outpatient Treatment and states he is highly motivated in following up at this level of care    Craige CottaFernando A Kiante Ciavarella, MD 01/13/2018, 11:19 AM

## 2018-01-13 NOTE — [facility] Group Notes (Signed)
[facility] Therapy      01/13/2018 11:17 AM  Type of Therapy: Mental Health Association Presentation  Participation Level: Active  Participation Quality: Attentive  Affect: Appropriate  Cognitive: Oriented  Insight: Developing/Improving  Engagement in Therapy: Engaged  Modes of Intervention: Discussion, Education and Socialization  Summary of Progress/Problems: Mental Health Association (MHA) Speaker came to talk about his personal journey with mental health. The pt processed ways by which to relate to the speaker. MHA speaker provided handouts and educational information pertaining to groups and services offered by the [facility]. Pt was engaged in speaker's presentation and was receptive to resources provided.    Baldo DaubJolan Humna Moorehouse LCSWA Clinical Social Worker ,

## 2018-01-13 NOTE — Progress Notes (Signed)
Recreation Therapy Notes  Date: 4.3.19 Time: 9:30 a.m. Location: [address] Day[address]   Group Topic: Stress Management   Goal Area(s) Addresses:  Goal 1.1: To reduce stress  -Patient will report feeling a reduction in stress level  -Patient will identify the importance of stress management  -Patient will participate during stress management group treatment     Intervention: Stress Management   Activity: Meditation- Patients were in a peaceful environment with soft lighting enhancing patients mood. Patients listened to a body scan meditation to help decrease tension and stress levels    Education: Stress Management, Discharge Planning.    Education Outcome: Acknowledges edcuation/In group clarification offered/Needs additional education   Clinical Observations/Feedback:: Patient did not attend     Sheryle HailDarian Maxton Noreen, Recreation Therapy Intern   Sheryle HailDarian Shoshanna Mcquitty 01/13/2018 8:18 AM

## 2018-01-13 NOTE — Progress Notes (Signed)
CSW contacted the patient's wife, [name] ([phone]) to discuss whether or not she had any concerns regarding the patient's discharge.   Per Cala BradfordKimberly, she has no concerns regarding the patient's safety, or the safety of she and their children. Cala BradfordKimberly reports that the patient can return to their home. Cala BradfordKimberly requested to be notified if the patient is scheduled for discharge today so that she can make the arrangement to pick the patient up.   There were no further questions or concerns. CSW will continue to follow.  Baldo DaubJolan Obera Stauch, MSW, LCSWA Clinical Social Worker [facility]  Phone: [phone]

## 2018-01-13 NOTE — Discharge Summary (Addendum)
Physician Discharge Summary Note  Patient:  Aaron [name] is an 42 y.o., male MRN:  [id number] DOB:  [birth year] Patient phone:  [phone] (home)  Patient address:   [address]  [place] [address],  Total Time spent with patient: 20 minutes  Date of Admission:  01/08/2018 Date of Discharge: 01/14/18  Reason for Admission:  Worsening depression with SI  Principal Problem: Severe recurrent major depression without psychotic features [facility]) Discharge Diagnoses: Patient Active Problem List   Diagnosis Date Noted  . Severe recurrent major depression without psychotic features (HCC) [F33.2] 01/08/2018    Past Psychiatric History: Depending on his story versus his wife's story he has a history of depression or bipolar disorder, most recently depressed.  He has 1 previous psychiatric [facility] at [facility] [place] [facility].  He was there for somewhere between 6-7 weeks  Past Medical History:  Past Medical History:  Diagnosis Date  . Allergy   . Chicken pox   . Depression    History reviewed. No pertinent surgical history. Family History:  Family History  Problem Relation Age of Onset  . Heart disease Father   . Depression Maternal Grandmother        commited suicide   Family Psychiatric  History: He stated his sister had depression  Social History:  Social History   Substance and Sexual Activity  Alcohol Use Yes  . Alcohol/week: 0.0 oz   Comment: occassional     Social History   Substance and Sexual Activity  Drug Use No    Social History   Socioeconomic History  . Marital status: Married    Spouse name: Not on file  . Number of children: Not on file  . Years of education: Not on file  . Highest education level: Not on file  Occupational History  . Not on file  Social Needs  . Financial resource strain: Not on file  . Food insecurity:    Worry: Not on file    Inability: Not on file  . Transportation needs:    Medical: Not on file     Non-medical: Not on file  Tobacco Use  . Smoking status: Never Smoker  . Smokeless tobacco: Never Used  Substance and Sexual Activity  . Alcohol use: Yes    Alcohol/week: 0.0 oz    Comment: occassional  . Drug use: No  . Sexual activity: Yes  Lifestyle  . Physical activity:    Days per week: Not on file    Minutes per session: Not on file  . Stress: Not on file  Relationships  . Social connections:    Talks on phone: Not on file    Gets together: Not on file    Attends religious service: Not on file    Active member of club or organization: Not on file    Attends meetings of clubs or organizations: Not on file    Relationship status: Not on file  Other Topics Concern  . Not on file  Social History Narrative   Bachelors Degree, Administrator, arts for [employer].    Married    Two daughters, one daughter on the way    He likes to sleep     Hospital Course:   01/08/18 [facility] MD Assessment: Patient is seen and examined.  Patient is a 42 year old male with a reported past psychiatric history for depression (that his wife tells [place] he is been previously diagnosed with bipolar disorder) who presented to the [facility] emergency  department with suicidal ideation.  The patient stated that he had a long history of depression over the last 2-3 years.  He had actually had a 6-7-week [place].  He stated that he had been having trouble most recently because of the breakup of his marriage.  He stated that his current wife had told him that they were breaking up, and since then he developed more helplessness, hopelessness and worthlessness as well as suicidal ideation.  Unfortunately this is his second breakup in the last several years.  He has an ex-wife with 2 other children that live in another state.  His current wife has an 2835-month-old child that is their biological child.  While the patient denied any manic symptoms, his wife stated that  his previous psychiatrist had diagnosed him with bipolar disorder, and the patient would often have anger issues.  It sounds as though that contributed significantly to their marital status.  He denied any racing thoughts, pressured speech.  He denied any excessive spending.  He denied being awake for several days at a time without getting tired.  He stated his current medications include Cymbalta, Saphris, Valium, BuSpar.  He stated he had previously been on higher dosages of Cymbalta, but was now on 60 mg.  He denied any previous suicide attempts, but had thoughts of suicide in the past.  He was admitted to the hospital for evaluation and stabilization.  He was also seen by treatment team today.  This included nursing and social work  [facility] and stabilized with medication and therapy. Patient was discharged on Saphris 5 mg QHS, Buspar 10 mg BID, Cymbalta 90 mg Daily, Lamictal 50 mg Daily, Vistaril 25 mg TID PRN, and Trazodone 50 mf QHS PRN. Patient showed improvement with improved mood, affect, sleep, appetite, and interaction. Patient has been seen in the day room interacting with peers and staff appropriately. Patient has been attending group and participating. Patient denies any SI/HI/AVH and contracts for safety. Patient agrees [facility] and will be attending IOP. Patient is provided with prescriptions for his medications upon discharge.      Physical Findings: AIMS: Facial and Oral Movements Muscles of Facial Expression: None, normal Lips and Perioral Area: None, normal Jaw: None, normal Tongue: None, normal,Extremity Movements Upper (arms, wrists, hands, fingers): None, normal Lower (legs, knees, ankles, toes): None, normal, Trunk Movements Neck, shoulders, hips: None, normal, Overall Severity Severity of abnormal movements (highest score from questions above): None, normal Incapacitation due to abnormal movements: None,  normal Patient's awareness of abnormal movements (rate only patient's report): No Awareness, Dental Status Current problems with teeth and/or dentures?: No Does patient usually wear dentures?: No  CIWA:  CIWA-Ar Total: 1 COWS:  COWS Total Score: 1  Musculoskeletal: Strength & Muscle Tone: within normal limits Gait & Station: normal Patient leans: N/A  Psychiatric Specialty Exam: Physical Exam  Nursing note and vitals reviewed. Constitutional: He is oriented to person, place, and time. He appears well-developed and well-nourished.  Respiratory: Effort normal.  Musculoskeletal: Normal range of motion.  Neurological: He is alert and oriented to person, place, and time.  Skin: Skin is warm.    Review of Systems  Constitutional: Negative.   HENT: Negative.   Eyes: Negative.   Respiratory: Negative.   Cardiovascular: Negative.   Gastrointestinal: Negative.   Genitourinary: Negative.   Musculoskeletal: Negative.   Skin: Negative.   Neurological:  Negative.   Endo/Heme/Allergies: Negative.   Psychiatric/Behavioral: Negative.     Blood pressure 121/86, pulse (!) 110, temperature 97.8 F (36.6 C), temperature source Oral, resp. rate 20, height 6\' 3"  (1.905 m), weight 106.1 kg (234 lb), SpO2 99 %.Body mass index is 29.25 kg/m.  General Appearance: Casual  Eye Contact:  Good  Speech:  Clear and Coherent and Normal Rate  Volume:  Normal  Mood:  Euthymic  Affect:  Congruent  Thought Process:  Goal Directed and Descriptions of Associations: Intact  Orientation:  Full (Time, Place, and Person)  Thought Content:  WDL  Suicidal Thoughts:  No  Homicidal Thoughts:  No  Memory:  Immediate;   Good Recent;   Good Remote;   Good  Judgement:  Good  Insight:  Good  Psychomotor Activity:  Normal  Concentration:  Concentration: Good and Attention Span: Good  Recall:  Good  Fund of Knowledge:  Good  Language:  Good  Akathisia:  No  Handed:  Right  AIMS (if indicated):     Assets:   Communication Skills Desire for Improvement Financial Resources/Insurance Housing Physical Health Social Support Transportation  ADL's:  Intact  Cognition:  WNL  Sleep:  Number of Hours: 6.75     Have you used any form of tobacco in the last 30 days? (Cigarettes, Smokeless Tobacco, Cigars, and/or Pipes): No  Has this patient used any form of tobacco in the last 30 days? (Cigarettes, Smokeless Tobacco, Cigars, and/or Pipes) Yes, No  Blood Alcohol level:  No results found for: [facility]  Metabolic Disorder Labs:  Lab Results  Component Value Date   HGBA1C 4.5 (L) 01/08/2018   MPG 82.45 01/08/2018   Lab Results  Component Value Date   PROLACTIN 62.2 (H) 01/08/2018   Lab Results  Component Value Date   CHOL 175 01/08/2018   TRIG 62 01/08/2018   HDL 75 01/08/2018   CHOLHDL 2.3 01/08/2018   VLDL 12 01/08/2018   LDLCALC 88 01/08/2018   LDLCALC 96 11/14/2016    See Psychiatric Specialty Exam and Suicide Risk Assessment completed by Attending Physician prior to discharge.  Discharge destination:  Home  Is patient on multiple antipsychotic therapies at discharge:  No   Has Patient had three or more failed trials of antipsychotic monotherapy by history:  No  Recommended Plan for Multiple Antipsychotic Therapies: NA   Allergies as of 01/13/2018   No Known Allergies     Medication List    STOP taking these medications   diazepam 5 MG tablet Commonly known as:  VALIUM     TAKE these medications     Indication  asenapine 5 MG Subl 24 hr tablet Commonly known as:  SAPHRIS Place 1 tablet (5 mg total) under the tongue at bedtime. For mood control What changed:    medication strength  when to take this  additional instructions  Indication:  Mood stability   busPIRone 10 MG tablet Commonly known as:  BUSPAR Take 1 tablet (10 mg total) by mouth 2 (two) times daily. For mood control What changed:    when to take this  additional instructions  Indication:  Mood  stability   cetirizine 10 MG tablet Commonly known as:  ZYRTEC Take 10 mg by mouth daily.  Indication:  Hayfever   DULoxetine 30 MG capsule Commonly known as:  CYMBALTA Take 3 capsules (90 mg total) by mouth daily. For mood control What changed:    medication strength  how much to take  additional instructions  Indication:  mood stability   hydrOXYzine 25 MG tablet Commonly known as:  ATARAX/VISTARIL Take 1 tablet (25 mg total) by mouth 3 (three) times daily as needed for anxiety.  Indication:  Feeling Anxious   lamoTRIgine 25 MG tablet Commonly known as:  LAMICTAL Take 2 tablets (50 mg total) by mouth at bedtime. For mood control  Indication:  mood stability   traZODone 50 MG tablet Commonly known as:  DESYREL Take 1 tablet (50 mg total) by mouth at bedtime as needed for sleep.  Indication:  Trouble Sleeping      Follow-up Information    [facility] Go on 01/18/2018.   Specialty:  Behavioral Health Why:  Medication management appointment with Dr.Jennings 01/18/18 at 9:00am. Contact information: [address] [place] [address] [phone]        [facility]. Go on 01/14/2018.   Why:  Therapy appointment 01/14/18 at 11:00am. Contact information: [address]  [address]  [place], [place] [address] Phone: [phone]       [facility]. Go on 01/13/2018.   Specialty:  Behavioral Health Why:  Please attend your Intensive Outpatient Program intake appt on Wednesday, 01/13/18, at 1:30pm. Contact information: [address] [place] [address] [phone]          Follow-up recommendations:  Continue activity as tolerated. Continue diet as recommended by your PCP. Ensure to keep all appointments with outpatient providers.  Comments:  Patient is instructed prior to discharge to: Take all medications as prescribed by his/her mental  healthcare provider. Report any adverse effects and or reactions from the medicines to his/her outpatient provider promptly. Patient has been instructed & cautioned: To not engage in alcohol and or illegal drug use while on prescription medicines. In the event of worsening symptoms, patient is instructed to call the crisis hotline, 911 and or go to the nearest ED for appropriate evaluation and treatment of symptoms. To follow-up with his/her primary care provider for your other medical issues, concerns and or health care needs.    Signed: Gerlene Burdock Money, FNP 01/14/2018, 7:57 AM   Patient seen, Suicide Assessment Completed.  Disposition Plan Reviewed

## 2018-01-13 NOTE — Progress Notes (Signed)
  [facility] Discharge [facility] :  Will you be returning to the same living situation after discharge:  Yes,  patient is returning home with his wife.  At discharge, do you have transportation home?: Yes,  patient reports that his wife will pick him up at discharge Do you have the ability to pay for your medications: Yes,  [employer]  Release of information consent forms completed and in the chart;  Patient's signature needed at discharge.  Patient to Follow up at: Follow-up Information    [facility] Go on 01/18/2018.   Specialty:  Behavioral Health Why:  Medication management appointment with Dr.Jennings 01/18/18 at 9:00am. Contact information: [address] [place] [address] [phone]        [facility]. Go on 01/14/2018.   Why:  Therapy appointment 01/14/18 at 11:00am. Contact information: [address]  [address]  [place], [place] [address] Phone: [phone]       [facility]. Go on 01/13/2018.   Specialty:  Behavioral Health Why:  Please attend your Intensive Outpatient Program intake appt on Wednesday, 01/13/18, at 1:30pm. Contact information: [address] [place] [id number] [phone]          Next level of care provider has [facility] to [facility] Link:yes  Safety Planning and Suicide Prevention discussed: Yes,  with the patient's wife, Aaron [name]  Have you used any form of tobacco in the last 30 days? (Cigarettes, Smokeless Tobacco, Cigars, and/or Pipes): No  Has patient been referred to the Quitline?: N/A patient is not a smoker  Patient has been referred for addiction treatment: N/A  Aaron [name], LCSWA 01/13/2018, 12:45 PM

## 2018-01-14 ENCOUNTER — Other Ambulatory Visit (HOSPITAL_COMMUNITY): Payer: Commercial Managed Care - PPO | Admitting: Specialist

## 2018-01-14 ENCOUNTER — Other Ambulatory Visit (HOSPITAL_COMMUNITY): Payer: Commercial Managed Care - PPO | Admitting: Licensed Clinical Social Worker

## 2018-01-14 DIAGNOSIS — R4589 Other symptoms and signs involving emotional state: Secondary | ICD-10-CM | POA: Diagnosis not present

## 2018-01-14 DIAGNOSIS — Z818 Family history of other mental and behavioral disorders: Secondary | ICD-10-CM | POA: Diagnosis not present

## 2018-01-14 DIAGNOSIS — F332 Major depressive disorder, recurrent severe without psychotic features: Secondary | ICD-10-CM

## 2018-01-14 DIAGNOSIS — Z8249 Family history of ischemic heart disease and other diseases of the circulatory system: Secondary | ICD-10-CM | POA: Diagnosis not present

## 2018-01-14 DIAGNOSIS — G4700 Insomnia, unspecified: Secondary | ICD-10-CM | POA: Diagnosis not present

## 2018-01-14 DIAGNOSIS — F0789 Other personality and behavioral disorders due to known physiological condition: Secondary | ICD-10-CM

## 2018-01-14 DIAGNOSIS — F419 Anxiety disorder, unspecified: Secondary | ICD-10-CM | POA: Diagnosis not present

## 2018-01-14 DIAGNOSIS — Z79899 Other long term (current) drug therapy: Secondary | ICD-10-CM | POA: Diagnosis not present
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
## 2018-01-15 ENCOUNTER — Other Ambulatory Visit: Payer: Self-pay

## 2018-01-15 ENCOUNTER — Other Ambulatory Visit (HOSPITAL_COMMUNITY): Payer: Commercial Managed Care - PPO | Admitting: Licensed Clinical Social Worker

## 2018-01-15 ENCOUNTER — Encounter (HOSPITAL_COMMUNITY): Payer: Self-pay

## 2018-01-15 ENCOUNTER — Encounter (HOSPITAL_COMMUNITY): Payer: Self-pay | Admitting: Family

## 2018-01-15 VITALS — BP 126/82 | HR 80 | Ht 74.75 in | Wt 236.0 lb

## 2018-01-15 DIAGNOSIS — F333 Major depressive disorder, recurrent, severe with psychotic symptoms: Secondary | ICD-10-CM

## 2018-01-15 DIAGNOSIS — F332 Major depressive disorder, recurrent severe without psychotic features: Secondary | ICD-10-CM
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
## 2018-01-15 NOTE — Psych (Cosign Needed Addendum)
Behavioral Health Partial Program Assessment Note  Date: 01/15/2018 Name: Aaron [name] MRN: [id number]  Chief Complaint:  Worsening  Depression   Subjective: Patient presents after a recent inpatient admission at [facility],  for worsening depression and anxiety. Patient reports suicidal ideations due to multiple stressors. Reports a strained relationship with current wife whom is requesting a divorce. Reports he is hopeful that they will be able to repair their marriage. Reports he is having a difficult  time telling his children about the relationship issues and the upcoming sell of the family home. Reports previous inpatient treatment at a [facility] ( reports this trauma based outpatient setting). Patient reports he is currently followed by MD.Jennings and states he is diagnosed  with Depression, PTSD and Anxiety. Reports he is taking medications as prescribed and tolerating medications well. ( listed below) denies any medication side effects currently. Patient reports insomnia, however reports states he is having family over this weekend and this anxiety may be attributed to his symptoms of worries. Patient presents with a  guarded and flat affect. Denies that is suicidal or homicidal during this assessment. Support, encouragement and reassurance was provided.    HPI: Patient is a 42 y.o. Caucasian male presents with depression, anxiety and PTSD .  Patient was enrolled in partial psychiatric program on 01/14/2018.  Primary complaints include: alcohol abuse, depression worse, feeling depressed, relationship difficulties and stressed at work.  Onset of symptoms was gradual with gradually worsening course since that time. Psychosocial Stressors include the following: family, financial, marital, occupational and drug and alcohol.   I have reviewed the following documentation dated 01/15/2018: past psychiatric history, past medical history and past Review of systems  Complaints of Pain:   Past Psychiatric History:  Past psychiatric hospitalizations   Currently in treatment with Sapphism 5 mg, Buspar 10 mg BID, Cymbalta 90 mg, Lamictal 50 mg BID and Trazodone 50 mg PRN   Substance Abuse History: alcohol Use of Alcohol: heavy mainly on weekends  Use of Caffeine: Use of over the counter:  No past surgical history on file.  Past Medical History:  Diagnosis Date  . Allergy   . Chicken pox   . Depression    Outpatient Encounter Medications as of 01/15/2018  Medication Sig  . asenapine (SAPHRIS) 5 MG SUBL 24 hr tablet Place 1 tablet (5 mg total) under the tongue at bedtime. For mood control  . busPIRone (BUSPAR) 10 MG tablet Take 1 tablet (10 mg total) by mouth 2 (two) times daily. For mood control  . cetirizine (ZYRTEC) 10 MG tablet Take 10 mg by mouth daily.  . DULoxetine (CYMBALTA) 30 MG capsule Take 3 capsules (90 mg total) by mouth daily. For mood control  . hydrOXYzine (ATARAX/VISTARIL) 25 MG tablet Take 1 tablet (25 mg total) by mouth 3 (three) times daily as needed for anxiety.  . lamoTRIgine (LAMICTAL) 25 MG tablet Take 2 tablets (50 mg total) by mouth at bedtime. For mood control  . traZODone (DESYREL) 50 MG tablet Take 1 tablet (50 mg total) by mouth at bedtime as needed for sleep.   No facility-administered encounter medications on file as of 01/15/2018.    No Known Allergies  Social History   Tobacco Use  . Smoking status: Never Smoker  . Smokeless tobacco: Never Used  Substance Use Topics  . Alcohol use: Yes    Alcohol/week: 0.0 oz    Comment: occassional   Functioning Relationships: Education:  Other Pertinent History:  Family History  Problem  Relation Age of Onset  . Heart disease Father   . Depression Maternal Grandmother        commited suicide     Review of Systems Constitutional: negative  Objective:  There were no vitals filed for this visit.  Physical Exam:   Mental Status Exam: Appearance:  Casually dressed Psychomotor::   Within Normal Limits Attention span and concentration: Normal Behavior: calm, cooperative and adequate rapport can be established Speech:  normal volume and soft Mood:  depressed and anxious Affect:  flat Thought Process:  Coherent and Goal Directed Thought Content:  Hallucinations: None Orientation:  person, place and time/date Cognition:  grossly intact Insight:  Intact Judgment:  Fair Estimate of Intelligence: Average Fund of knowledge: Aware of current events Memory: Recent and remote intact Abnormal movements: None Gait and station: Normal  Assessment:  Diagnosis: No primary diagnosis found. No diagnosis found.  Indications for admission: inpatient care required if not in partial hospital program  Plan: patient enrolled in Partial Hospitalization Program, a comprehensive treatment plan will be developed and side effects of medications have been reviewed with patient  Treatment options and alternatives reviewed with patient and patient understands the above plan.  Orders placed for OTC Admit to PHP  Continue with current medication regimen - will follow-up with insomnia    Oneta Rackanika N Dakota Vanwart, NP

## 2018-01-15 NOTE — Progress Notes (Addendum)
Patient presents with flat affect, depressed mood and rated his current depression a 3-4, anxiety a 5 and hopelessness a 5 on a scale of 0-10 with 0 being none and 10 the worst he could manage.  Patient admitted he has had some suicidal ideations over the past week but none at present and no plan or intent to harm self or others. Patient shared that he was recently hospitalized after continued increase in depressive symptoms and that the started to plan out how he would harm himself, by wrecking his car.  Patient denied any of these thoughts at present and reported no thoughts about suicide over the past 2-3 days.  Patient reviewed all is current medications with this nurse with only concern for still not sleeping through the night. States that he is able to go to sleep and sleeps fine for awhile but then wakes up and has difficulty sleeping the rest of the night.  Patient agreed to monitor and log his sleep over the coming weekend as will discuss further with nurse practitioner and if need to adjust medications.  Patient stated the NP informed him she would adjust meds to help sleep as well earlier this date if becomes a problem.  Patient scored an 5218 on his PHQ9 depression screening today and shared he is going through a lot personally at this time, is separating from his second wife, but still living in the same house and is unhappy with his job at [employer] that he has worked for the past 10 years.  Patient reported he has 3 children, 2 daughters from a previous marriage and an 2318 month old son from his current marriage.  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  States financially they are stable but that he feels he lacks time with family or to enjoy any of what they share due to working a lot and second shift.  Patient reported desire to really learn more coping skills and ways  to manage thoughts when they turn negative or to thoughts of wanting to harm self.  States he sees Dr. Marlyne BeardsJennings as his primary psychiatrist but no other providers.  Reports some history with individual and marriage therapy but that he was not happy with past therapist that he was seeing for marriage counseling.  Patient reported no current thoughts of suicide or homicide and agreed to inform this nurse or PHP staff if any worsening of symptoms and reported how he would manage if any thoughts start again and if driving to keep self safe.  Patient stable at this time [facility].  Reported desire to work on skills and with staff to help him make decisions for the future but to also do this while keeping himself safe and with managing medications. Patient will continue with PHP and returned to group today without any other noted concerns.

## 2018-01-18 ENCOUNTER — Other Ambulatory Visit (HOSPITAL_COMMUNITY): Payer: Commercial Managed Care - PPO | Attending: Psychiatry | Admitting: Licensed Clinical Social Worker

## 2018-01-18 DIAGNOSIS — F332 Major depressive disorder, recurrent severe without psychotic features: Secondary | ICD-10-CM | POA: Insufficient documentation

## 2018-01-19 ENCOUNTER — Other Ambulatory Visit (HOSPITAL_COMMUNITY): Payer: Commercial Managed Care - PPO | Admitting: Occupational Therapy

## 2018-01-19 ENCOUNTER — Other Ambulatory Visit (HOSPITAL_COMMUNITY): Payer: Commercial Managed Care - PPO | Admitting: Licensed Clinical Social Worker

## 2018-01-19 DIAGNOSIS — R4589 Other symptoms and signs involving emotional state: Secondary | ICD-10-CM

## 2018-01-19 DIAGNOSIS — F332 Major depressive disorder, recurrent severe without psychotic features: Secondary | ICD-10-CM

## 2018-01-19 DIAGNOSIS — F0789 Other personality and behavioral disorders due to known physiological condition: Secondary | ICD-10-CM

## 2018-01-19 NOTE — Psych (Signed)
[facility]  Aaron [name] [id number]  Session Time: 9:00 - 10:30  Participation Level: Active  Behavioral Response: CasualAlertDepressed  Type of Therapy: Group Therapy; Psychotherapy  Treatment Goals addressed: Coping  Interventions: CBT, DBT, Solution Focused, Supportive and Reframing  Summary: Clinician led check-in regarding current stressors and situation, and review of patient completed daily inventory. Clinician utilized active listening and empathetic response and validated patient emotions. Clinician facilitated processing group on pertinent issues.   Therapist Response: Aaron GrieveDamian [name] is a 42 y.o. male who presents with depression and anxiety symptoms. Patient arrived within time allowed and reports that he is feeling "anxious." Pt rates his mood at a 6 on a scale of 1-10 with 10 being great. Pt reports that he had a good evening and was able to get some errands done. Pt reported that he did work around the house and was able to have dinner with his family. Pt reports he is still working setting appropriate goals for himself that are going to satisfy other members in his family. Pt engaged in activity and discussion.       Session Time: 10:30 -11:30   Participation Level: Active   Behavioral Response: CasualAlertDepressed   Type of Therapy: Group Therapy, OT   Treatment Goals addressed: Coping   Interventions: Psychosocial skills training, Supportive,    Summary:  Occupational Therapy group   Therapist Response: Patient engaged in group. See OT note.           Session Time: 11:30 - 12:15   Participation Level: Active   Behavioral Response: CasualAlertDepressed   Type of Therapy: Group Therapy, Psychotherapy; Psychoeducation   Treatment Goals addressed: Coping   Interventions: CBT, Solution focused, Supportive, Reframing   Summary: Cln introduced topic of communication. Cln discussed 3 components of communication: nonverbals,  words, and listening. Group discussed nonverbals: what they are and how they affect communication.    Therapist Response: Patient engaged in group. Pt was able to give examples of nonverbal and demonstrated awareness of how his nonverbals are taken.        Session Time: 12:15 - 1:00  Participation Level: Active  Behavioral Response: CasualAlertDepressed  Type of Therapy: Group Therapy, Activity Therapy  Treatment Goals addressed: Coping  Interventions: Psychologist, occupationalocial Skills Training, Supportive  Summary:  Reflection Group: Patients encouraged to practice skills and interpersonal techniques or work on mindfulness and relaxation techniques. The importance of self-care and making skills part of a routine to increase usage were stressed   Therapist Response: Patient engaged and participated appropriately.       Session Time: 12:45- 2:00  Participation Level: Active  Behavioral Response: CasualAlertAnxious and Depressed  Type of Therapy: Group Therapy, Psychoeducation; Psychotherapy  Treatment Goals addressed: Coping  Interventions: CBT; Solution focused; Supportive; Reframing  Summary: 12:45 - 1:50: Clinician continued topic of communication. Cln educated group on 4 communication styles: assertive, aggressive, passive, and passive-aggressive. Group gave examples of the 4 styles and identified which is their default style.  1:50 -2:00 Clinician led check-out. Clinician assessed for immediate needs, medication compliance and efficacy, and safety concerns   Therapist Response: Pt reported that he has a difficult time communicating with others. Pt shared that he finds it hard to know when to engage and disengage in a conversation at times. Pt reported that he identifies with a passive communication style.  At check-out, patient rates his mood at a 5 on a scale of 1-10 with 10 being great. Pt report that he is going to  work on getting his house ready to sale when it gets home. Pt  demonstrates some progress as evidenced by participating fully on his first day of group. Pt denies SI/HI/self-harm thoughts at the end of group.        Suicidal/Homicidal: Nowithout intent/plan    Plan: Pt will continue in PHP with medication management while working to decrease depression and anxiety symptoms, increase ability to self manage symptoms, and eliminate SI.   Diagnosis: Severe recurrent major depression without psychotic features (HCC) [F33.2]    1. Severe recurrent major depression without psychotic features [facility])       Donia Guiles, LCSW 01/19/2018

## 2018-01-19 NOTE — Psych (Signed)
[facility] PHP THERAPIST PROGRESS NOTE  Aaron [name] [id number]  Session Time: 9:00 - 10:30  Participation Level: Active  Behavioral Response: CasualAlertDepressed  Type of Therapy: Group Therapy; Psychotherapy  Treatment Goals addressed: Coping  Interventions: CBT, DBT, Solution Focused, Supportive and Reframing  Summary: Clinician led check-in regarding current stressors and situation, and review of patient completed daily inventory. Clinician utilized active listening and empathetic response and validated patient emotions. Clinician facilitated processing group on pertinent issues.   Therapist Response: Aaron [name] is a 42 y.o. male who presents with depression and anxiety symptoms. Patient arrived within time allowed and reports that he is feeling "pretty good." Pt rates his mood at a 8 on a scale of 1-10 with 10 being great. Pt reports that he had a good weekend and was able to get some projects done around the house. Pt shares his family including mom, brother and sister-in-law came to visit him from [place] and that the visit went well.  Pt reports he is still working on his relationships and managing expectations. Pt continues to present in stoic manner and struggle to engage in a deep level. Pt engaged in activity and discussion.        Session Time: 10:30 - 12:00   Participation Level: Active   Behavioral Response: CasualAlertDepressed   Type of Therapy: Group Therapy, Psychotherapy; Psychoeducation   Treatment Goals addressed: Coping   Interventions: CBT, Solution focused, Supportive, Reframing   Summary: Cln continued topic of communication. Cln introduced "I" Statements and how to formulate them as well as common pitfalls and how to avoid them.    Therapist Response: Patient engaged in group. Pt reports understanding of "I" Statements and successfully formulated own in practice.       Session Time: 12:00 - 12:45  Participation Level: Active  Behavioral  Response: CasualAlertDepressed  Type of Therapy: Group Therapy, Activity Therapy  Treatment Goals addressed: Coping  Interventions: Psychologist, occupational, Supportive  Summary:  Reflection Group: Patients encouraged to practice skills and interpersonal techniques or work on mindfulness and relaxation techniques. The importance of self-care and making skills part of a routine to increase usage were stressed   Therapist Response: Patient engaged and participated appropriately.        Session Time: 12:45- 2:00  Participation Level: Active  Behavioral Response: CasualAlertDepressed  Type of Therapy: Group Therapy, Psychoeducation; Psychotherapy  Treatment Goals addressed: Coping  Interventions: CBT; Solution focused; Supportive; Reframing  Summary: 12:00 - 12:50: Clinician led communication workshop in which group members shared issues with communication and other group members helped problem solve the concern and respond in an assertive way.  12:50 -1:00 Clinician led check-out. Clinician assessed for immediate needs, medication compliance and efficacy, and safety concerns   Therapist Response: Patient engaged in activity and discussion. Pt provided feedback and support for group members in workshop and integrated information covered on communication thus far. Pt volunteered example of trying to communicate with someone who wants to lecture and not listen.  At check-out, patient rates his mood at a 8 on a scale of 1-10 with 10 being great. Patient reports plans of spending time with children this afternoon.  Patient demonstrates some progress as evidenced by increased openness with issues however remains vague when discussing personal life. Patient denies SI/HI/self-harm at the end of group.      Suicidal/Homicidal: Nowithout intent/plan    Plan: Pt will continue in PHP with medication management while working to decrease depression and anxiety symptoms, increase ability to  self manage symptoms, and eliminate SI.   Diagnosis: Severe recurrent major depression without psychotic features (HCC) [F33.2]    1. Severe recurrent major depression without psychotic features [facility](HCC)       Donia GuilesJenny Yunior Jain, LCSW 01/19/2018

## 2018-01-20 ENCOUNTER — Encounter (HOSPITAL_COMMUNITY): Payer: Self-pay | Admitting: Occupational Therapy

## 2018-01-20 ENCOUNTER — Encounter (HOSPITAL_COMMUNITY): Payer: Self-pay | Admitting: Family

## 2018-01-20 ENCOUNTER — Other Ambulatory Visit (HOSPITAL_COMMUNITY): Payer: Commercial Managed Care - PPO | Admitting: Licensed Clinical Social Worker

## 2018-01-20 VITALS — BP 127/82 | HR 67 | Temp 97.7°F | Resp 16 | Wt 244.0 lb

## 2018-01-20 DIAGNOSIS — F332 Major depressive disorder, recurrent severe without psychotic features: Secondary | ICD-10-CM

## 2018-01-20 MED ORDER — BUSPIRONE HCL 10 MG PO TABS
10.0000 mg | ORAL_TABLET | Freq: Three times a day (TID) | ORAL | 0 refills | Status: DC
Start: 2018-01-20 — End: 2018-01-20

## 2018-01-20 MED ORDER — LAMOTRIGINE 25 MG PO TABS: 75.0000 mg | ORAL_TABLET | Freq: Every day | ORAL | 0 refills | Status: DC

## 2018-01-20 MED ORDER — BUSPIRONE HCL 10 MG PO TABS: 10.0000 mg | ORAL_TABLET | Freq: Three times a day (TID) | ORAL | 0 refills | Status: DC

## 2018-01-20 MED ORDER — TRAZODONE HCL 50 MG PO TABS: 100.0000 mg | ORAL_TABLET | Freq: Every evening | ORAL | 0 refills | Status: DC | PRN

## 2018-01-20 MED ORDER — LAMOTRIGINE 25 MG PO TABS
75.0000 mg | ORAL_TABLET | Freq: Every day | ORAL | 0 refills | Status: DC
Start: 2018-01-20 — End: 2018-01-20

## 2018-01-20 NOTE — Progress Notes (Addendum)
Spiritual care group 01/19/2018 11:00-12:00  Facilitated by Wilkie Ayehaplain Marni Franzoni, MDiv    Group focused on topic of "self-care"  Patients engaged in facilitated discussion about topic.  Explored quotes related to self care and chose one which they agreed with and one which they disliked.  Engaged in discussion around quote choices and their experience / understanding of care for themselves.    Peyton NajjarDamian was present at beginning of group.  Pulled from group to work with Charity fundraiserN.  Was not able to return to group until close.  Briefly commented on quotes he selected.  Stated that he finds it difficult to find space for self care, as he is pulled in a variety of directions - responsibility for children, job, etc.   He chose a quote that encouraged to "be yourself, because those who mind don't matter, and those who matter don't mind"   Spoke briefly about those closest to us - who are not supposed to judge us - often being the ones who can hurt us the most.   We were not able to explore much in dialog, as group ended shortly after American ForkDamian returned.     [facility] Chaplain Burnis KingfisherMatthew Madason Rauls, MDiv, [facility]

## 2018-01-20 NOTE — Progress Notes (Signed)
BH MD/PA/NP PHP-Progress Note  01/20/2018 4:59 PM Alphonzo GrieveDamian Web  MRN:  [id number]  Evaluation: Patient seen sitting in group session.  Patient continues to present with a flat and guarded affect.  Reports ongoing symptoms of worry and increased anxiety due to "fear of the unknown" with his marital status.  Reports they have recently decided to place her house for sale which is added additional stress to his current financial situation.  Reports communication with his wife continues to be strained. Peyton NajjarDamian reports group sessions is helping but effective communication on his part reports he is taking medication as prescribed.  Will adjust Lamictal 25mg  to Lamictal  50 mg.  Patient to increase trazodone 50 mg trazodone 100 mg.  Continues to tolerate them well.  Reports he has noticed some improvement with his sleeping cycle.  Reports his depression has increased rates his depression as 6 out of 10 today.  Denies suicidal or homicidal ideations during this assessment.  support and encouragement reassurance was provided.   History: Per [facility] note- Beulah 42 y.o caucasian male - Pt reported that he has had depression and anxiety for about 10 years. Due to a recent separation with his wife, pt has seen an increase in his anxiety and depression. Pt stated that he has noticed he was been over sleeping, has racing thoughts, hopelessness, thoughts of dying, and dread. Pt reported that he is currently still living with his wife until they are able to sell their house. Last Thursday, patient [facility] inpatient [facility] and was there for 6 days. 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 Pt stated that while he was getting ready for work each day, he felt dread about going to work and was anxious. Pt [place] inpatient [place] in [place] at [facility] last fall for 6 weeks due to depression, anxiety, and SI. Pt admitted that he learned a lot of great skills there but was unable  to use them once returning home due to coming back to the same environment he was in before. Pt sees Dr. Marlyne BeardsJennings for psychiatry and was seeing a [facility] Amisamara Riley for EMDR and couples therapy. Pt did not see improvement with EMDR and saw minimal success with couple's therapy. Pt had an appointment with a therapist at [facility] last week and realized they were out of network.   Visit Diagnosis:    ICD-10-CM   1. Severe recurrent major depression without psychotic features (HCC) F33.2     Past Psychiatric History:   Past Medical History:  Past Medical History:  Diagnosis Date  . Allergy   . Chicken pox   . Depression    History reviewed. No pertinent surgical history.  Family Psychiatric History:   Family History:  Family History  Problem Relation Age of Onset  . Heart disease Father   . Depression Maternal Grandmother        commited suicide    Social History:  Social History   Socioeconomic History  . Marital status: Married    Spouse name: Not on file  . Number of children: Not on file  . Years of education: Not on file  . Highest education level: Not on file  Occupational History  . Not on file  Social Needs  . Financial resource strain: Not hard at all  . Food insecurity:    Worry: Never true    Inability: Never true  . Transportation needs:    Medical: No  Non-medical: No  Tobacco Use  . Smoking status: Never Smoker  . Smokeless tobacco: Never Used  Substance and Sexual Activity  . Alcohol use: Yes    Alcohol/week: 7.2 oz    Types: 12 Cans of beer per week    Comment: occassional  . Drug use: No  . Sexual activity: Not Currently  Lifestyle  . Physical activity:    Days per week: 3 days    Minutes per session: 40 min  . Stress: Very much  Relationships  . Social connections:    Talks on phone: Never    Gets together: Never    Attends religious service: 1 to 4 times per year    Active member of club or organization: No    Attends meetings of clubs  or organizations: Never    Relationship status: Separated  Other Topics Concern  . Not on file  Social History Narrative   Bachelors Degree, Administrator, arts for [employer].    Married    Two daughters, one daughter on the way    He likes to sleep     Allergies: No Known Allergies  Metabolic Disorder Labs: Lab Results  Component Value Date   HGBA1C 4.5 (L) 01/08/2018   MPG 82.45 01/08/2018   Lab Results  Component Value Date   PROLACTIN 62.2 (H) 01/08/2018   Lab Results  Component Value Date   CHOL 175 01/08/2018   TRIG 62 01/08/2018   HDL 75 01/08/2018   CHOLHDL 2.3 01/08/2018   VLDL 12 01/08/2018   LDLCALC 88 01/08/2018   LDLCALC 96 11/14/2016   Lab Results  Component Value Date   TSH 2.288 01/08/2018   TSH 1.33 11/14/2016    Therapeutic Level Labs: No results found for: LITHIUM No results found for: VALPROATE No components found for:  CBMZ  Current Medications: Current Outpatient Medications  Medication Sig Dispense Refill  . asenapine (SAPHRIS) 5 MG SUBL 24 hr tablet Place 1 tablet (5 mg total) under the tongue at bedtime. For mood control 30 tablet 0  . busPIRone (BUSPAR) 10 MG tablet Take 1 tablet (10 mg total) by mouth 3 (three) times daily. For mood control 60 tablet 0  . cetirizine (ZYRTEC) 10 MG tablet Take 10 mg by mouth daily.    . DULoxetine (CYMBALTA) 30 MG capsule Take 3 capsules (90 mg total) by mouth daily. For mood control 90 capsule 0  . hydrOXYzine (ATARAX/VISTARIL) 25 MG tablet Take 1 tablet (25 mg total) by mouth 3 (three) times daily as needed for anxiety. 30 tablet 0  . lamoTRIgine (LAMICTAL) 25 MG tablet Take 3 tablets (75 mg total) by mouth at bedtime. For mood control 60 tablet 0  . traZODone (DESYREL) 50 MG tablet Take 2 tablets (100 mg total) by mouth at bedtime as needed for sleep. 30 tablet 0   No current facility-administered medications for this visit.      Musculoskeletal: Strength & Muscle Tone: within normal  limits Gait & Station: normal Patient leans: N/A  Psychiatric Specialty Exam: ROS  Blood pressure 127/82, pulse 67, temperature 97.7 F (36.5 C), resp. rate 16, weight 244 lb (110.7 kg), SpO2 98 %.Body mass index is 30.7 kg/m.  General Appearance: Casual and Guarded  Eye Contact:  Fair  Speech:  Clear and Coherent  Volume:  Normal  Mood:  Depressed  Affect:  Congruent  Thought Process:  Coherent  Orientation:  Full (Time, Place, and Person)  Thought Content: Hallucinations: None and Rumination   Suicidal  Thoughts:  No  Homicidal Thoughts:  No  Memory:  Immediate;   Good Remote;   Good  Judgement:  Fair  Insight:  Present  Psychomotor Activity:  Normal  Concentration:  Concentration: Fair  Recall:  Fair  Fund of Knowledge: Fair  Language: Fair  Akathisia:  No  Handed:  Right  AIMS (if indicated):   Assets:  Communication Skills Desire for Improvement Resilience Social Support  ADL's:  Intact  Cognition: WNL  Sleep:  Fair   Screenings: AIMS     [facility] (Dis[facility]) [facility]  AIMS Total Score  0    AUDIT     Admission (Discharged) from [facility]  Alcohol Use Disorder Identification Test Final Score (AUDIT)  3    GAD-7     Counselor from [facility]  Total GAD-7 Score  17    PHQ2-9     Counselor from 01/15/2018 in [facility]  PHQ-2 Total Score  6  PHQ-9 Total Score  18       Assessment and Plan: Continue Partial Hospitalization  MDD:  -Increased  Lamictal  50 mg to 75 mg po QHS  -Continue Cymbalta 90 mg po Q daily  -Continue Saphris 5 po mg po QHS   Anxiety:  - Increased Buspar 10 mg to PO BID to TID  - Increased Vistaril 25 mg to 50 mg PRN   Insomnia:  - Increased Trazodone 50 mg to 100 mg PRN   Treatment plan was reviewed and  agreed upon by NP T.Lewis inpatient Alphonzo Grieve for continued need group services.   Oneta Rack, NP 01/20/2018, 4:59 PM

## 2018-01-20 NOTE — Progress Notes (Signed)
Aaron NajjarDamian presents subdued and guarded. Minimal with responses and required great effort to intimate responses. Currently rates his Anxiety a 4 (scale 1-10 with 10 being most anxious) and his Depression a 3 (scale 1-10 with 10 being most depressed). Feels like group therapy is going well. States he feels less anxious now vs time of arrival. Points to major stressors of facing separation from his wife, selling their home, job dissatisfaction, separating from his child with current spouse and the effect of all of this on his 2 daughters from his previous marriage. States he hasnt told his daughters, but is going to this weekend most likely. He thinks they are going to be "crushed". 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 States he feels like he doesn't have an opportunity to be happy any longer as he doesn't have any new prospects for new projects with his employer and his job has been reduced to working on the same old things which is not challenging. He does acknowledge that his current employer is a good company that has great benefits, but a poor growth model for his department. Discussed option of working on new business "on the side" until he is in a better position to leave current job. States he doesn't feel like he and his wife communicate well and they have been increasingly verbally abusive. Encouraged him to try to avoid taking interactions in that direction and try to communicate on a level that brought them together initially. He also states his ETOH consumption has recently increased. Reports consuming a 6pk per day on the weekends (up from a 6pk the entire weekend) and now drinks 3-4 beers every evening (up from 1 per evening). States he rarely eats a substantial dinner r/t problems with his wife and anxiety. Cautioned against isolating and drinking more. Does feel like he wants to stay with  current wife but he questions her motivation to do so. Encourage to keep trying if he feels like it is worth fighting for. Denies SI/HI/AVH. Denies other substance abuse. Support and encouragement provided. Taking his meds as prescribed with some reduction in doses noted. No complaint of side effects. Otherwise he offered no questions or concerns.

## 2018-01-20 NOTE — Therapy (Signed)
[facility] [address] [place], [place], [address] Phone: [phone]   Fax:  [phone]  Occupational Therapy Treatment  Patient Details  Name: Aaron GrieveDamian Rowser MRN: [id number] Date of Birth: 1976-06-02 Referring Provider:  Hillery Jacksanika Lewis   Encounter Date: 01/19/2018  OT End of Session - 01/20/18 0904    Visit Number  2    Number of Visits  6    Date for OT Re-Evaluation  02/05/18    Authorization Type  UMR/UHC    OT Start Time  1030    OT Stop Time  1130    OT Time Calculation (min)  60 min    Activity Tolerance  Patient tolerated treatment well    Behavior During Therapy  [facility] for tasks assessed/performed       Past Medical History:  Diagnosis Date  . Allergy   . Chicken pox   . Depression     History reviewed. No pertinent surgical history.  There were no vitals filed for this visit.  Subjective Assessment - 01/20/18 0904    Currently in Pain?  No/denies         [facility] OT Assessment - 01/19/18 1729      Assessment   Medical Diagnosis  Major Depressive Disorder       Precautions   Precautions  None         OT Group: Accountability  S: Accountability is being responsible for our actions.   O: Group opened with members defining accountability and specifying personal accountability and what this entails-knowing what you should do and when you should do it. Pt was educated on three types of accountability: actions and choices, responsibilities, and goals. Pt provided with examples and identified their personal struggles. Pt participated in the "Blame Game", each person choosing a statement out of a cup and giving an example of a situation in which this would be used, then discussing how they could turn the situation around and be accountable for their actions, thus improving the situation/communication/relationship. Pt then used a bull's eye concept to identify their values in each of four realms-work,  relationships, personal growth/health, and leisure-to see where they are in each of these areas. Pt then created 4 goals-immediate, short-term, medium-term, and long-term-that aligned with these values and will promote improved accountability to reach these goals.    A: Patient participated in skilled occupational therapy group for accountability skills this date. Patient was actively engaged during session and appears open to strategies introduced.   P: Continue participation in skilled occupational therapy groups  1-2 times per week in order to gain the necessary skills needed to return to full time community living and learn effective coping strategies to be a productive community resident.                  OT Short Term Goals - 01/20/18 0905      OT SHORT TERM GOAL #1   Title  Patient will be educated on strategies to improve psychosocial skills needed to participate fully in all daily, work, and leisure activities.    Time  3    Period  Weeks    Status  On-going      OT SHORT TERM GOAL #2   Title  Patient will be educated on a HEP and independent with implementation of HEP.    Time  3    Period  Weeks    Status  On-going      OT  SHORT TERM GOAL #3   Title  Patient will independently apply psychosocial skills and coping mechanisms to his daily activities in order to function independently.    Time  3    Period  Weeks    Status  On-going               Plan - 01/20/18 4034    OT Treatment/Interventions  Self-care/ADL training;Psychosocial skills training;Coping strategies training;Other (comment) community reintegration    Consulted and Agree with Plan of Care  Patient       Patient will benefit from skilled therapeutic intervention in order to improve the following deficits and impairments:  Decreased coping skills, Decreased psychosocial skills(decreased independence with ADLs and community integration)  Visit Diagnosis: Severe recurrent major  depression without psychotic features (HCC)  Difficulty coping  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 Severe recurrent major depression without psychotic features (HCC) 01/08/2018   Ezra Sites, OTR/L  [phone] 01/20/2018, 9:05 AM  [facility] PARTIAL HOSPITALIZATION PROGRAM [address] [place], [place], [address] Phone: [phone]   Fax:  [phone]  Name: Aaron [name] MRN: [id number] Date of Birth: 29-Jul-1976

## 2018-01-21 ENCOUNTER — Other Ambulatory Visit (HOSPITAL_COMMUNITY): Payer: Commercial Managed Care - PPO | Admitting: Specialist

## 2018-01-21 ENCOUNTER — Other Ambulatory Visit (HOSPITAL_COMMUNITY): Payer: Commercial Managed Care - PPO | Admitting: Licensed Clinical Social Worker

## 2018-01-21 ENCOUNTER — Encounter (HOSPITAL_COMMUNITY): Payer: Self-pay

## 2018-01-21 DIAGNOSIS — R4589 Other symptoms and signs involving emotional state: Secondary | ICD-10-CM

## 2018-01-21 DIAGNOSIS — F0789 Other personality and behavioral disorders due to known physiological condition: Secondary | ICD-10-CM

## 2018-01-21 DIAGNOSIS — F332 Major depressive disorder, recurrent severe without psychotic features: Secondary | ICD-10-CM | POA: Diagnosis not present

## 2018-01-21 NOTE — Psych (Signed)
[facility] PHP THERAPIST PROGRESS NOTE  Aaron [name] [id number]  Session Time: 9:00 - 10:00   Participation Level: Active   Behavioral Response: CasualAlertDepressed   Type of Therapy: Group Therapy, Psychotherapy   Treatment Goals addressed: Coping   Interventions: CBT, DBT, Solution Focused, Supportive and Reframing  Summary: Clinician led check-in regarding current stressors and situation, and review of patient completed daily inventory. Clinician utilized active listening and empathetic response and validated patient emotions. Clinician facilitated processing group on pertinent issues.   Therapist Response: Patient arrived within time allowed and reports that he is feeling "anxious. I have family coming in from out of town that I have not seen in over 2 years." Pt rates his mood at a "6ish" on a scale of 1-10 with 10 being great Pt reports he had a good evening and got a lot of chores done. Pt reported that he painted the windows at his house. Pt stated that he went on a run again this morning to help reduce his worry and anxiety about his family coming to town. Pt reports that he is still working on feeling comfortable with being vulnerable and the proper way to communicate with others when nervous. Pt engaged in discussion.   Session Time: 10:00 - 11:00   Participation Level: Active   Behavioral Response: CasualAlertDepressed   Type of Therapy: Group Therapy, Psychoeducation, Psychotherapy   Treatment Goals addressed: Coping   Interventions: CBT, DBT, Solution Focused, Supportive and Reframing  Summary: Cln introduced the topic of mindfulness.  Group watched the TedTalk "Don't Try To Be Mindful." Cln led discussion about the TedTalk. Pts identified what mindfulness means to them.  Therapist Response:  Pt engaged in activity and discussion. Pt shared that he finds mindfulness to be challenging.   Session Time: 11:00 - 12:00   Participation Level: Active   Behavioral  Response: CasualAlertDepressed   Type of Therapy: Group Therapy, Psychoeducation, Psychotherapy   Treatment Goals addressed: Coping   Interventions: CBT, DBT, Solution Focused, Supportive and Reframing   Summary: Cln continued with topic of mindfulness.  Cln introduced "What" and "How" skills of mindfulness.  Group discussed the difficulties of mindfulness. Pts reported the hardest part about mindfulness to them.  Therapist Response: Pt engaged in activity and discussion. Pt reported that he has a hard time being aware of one sense at a time.   Session Time: 12:00 - 1:00   Participation Level: Active   Behavioral Response: CasualAlertDepressed   Type of Therapy: Group Therapy, Psychoeducation, Activity Therapy, Psychotherapy   Treatment Goals addressed: Coping   Interventions: CBT, DBT, Solution Focused, Supportive and Reframing  Summary: 12:00-12:50: Cln led mindfulness workshop and allowed patents to practice different mindfulness techniques, including guided mediation, coloring, and mindful eating.   12:50 - 1:00 Clinician led check-out. Clinician assessed for immediate needs, medication compliance and efficacy, and safety concerns   Therapist Response: Pt engaged in activity and discussion. Pt reported that he likes to meditate and has plans of incorporating this exercise into his routine from now on. At checkout, pt rates his mood at a 7 on a scale of 1-10 with 10 being great. Pt reports that he is going to try to appreciate the time he gets to spend with his family this weekend by relaxing.  Pt demonstrates some progress as evidenced by sharing more in group that his first session and finding ways to incorporate mindfulness into his daily life. Pt denied SI/HI/self-harm at the end of group.    Suicidal/Homicidal:  Nowithout intent/plan    Plan: Pt will continue in PHP with medication management while working to decrease depression and anxiety symptoms, increase ability to  self manage symptoms, and eliminate SI.   Diagnosis: Severe recurrent major depression without psychotic features (HCC) [F33.2]    1. Severe recurrent major depression without psychotic features (HCC)   2. Severe episode of recurrent major depressive disorder, with psychotic features (HCC)       Quinn AxeWhitney J Cardin Nitschke, LPCA 01/21/2018

## 2018-01-21 NOTE — Therapy (Signed)
[facility] [address] [place], [place], [address] Phone: [phone]   Fax:  [phone]  Occupational Therapy Treatment  Patient Details  Name: Aaron [name] MRN: [id number] Date of Birth: 06-16-1976 Referring Provider:  Hillery Jacksanika Lewis   Encounter Date: 01/21/2018  OT End of Session - 01/21/18 1330    Visit Number  3    Number of Visits  6    Date for OT Re-Evaluation  02/05/18    Authorization Type  UMR/UHC    OT Start Time  1030    OT Stop Time  1130    OT Time Calculation (min)  60 min    Activity Tolerance  Patient tolerated treatment well    Behavior During Therapy  [facility] for tasks assessed/performed       Past Medical History:  Diagnosis Date  . Allergy   . Chicken pox   . Depression     History reviewed. No pertinent surgical history.  There were no vitals filed for this visit.  Subjective Assessment - 01/21/18 1330    Currently in Pain?  No/denies          S:  I have used jogging and external supports in the past to get me through tough times.  O:  patient participated in skilled OT group focusing on the relationship between an individual's mental and physical health.  Patient was educated and discussed the definitions of health, well-being, mental health, depression.  Patient was educated and discussed the importance of emotional wellbeing.  Correlation between physical and mental health was discussed.  Patient educated on strategies to implement to improve both physical and mental health:  healthy diet, sufficient sleep, exercise, setting realistic goals, having support system.  Group was educated on resiliency and its importance for maintaining positive mental and physical health.  Group discussed definition of resilience, benefits of improved resilience and 10 ways to develop resilience.  Education around the "[employer]" acronym for the foundations of resilience were discussed.  Group concluded with  activity in which group listed thoughts they had experienced throughout the day, and worked on changing negative thoughts to positive thoughts.   A:  Patient was engaged throughout session.  Patient able to recognize current resiliency building strategies and identify addintional strategies that will be helpful to utilize.   P:  Continue skilled OT group 2 times per week for 3 weeks.  Next session follow up on what strategy was implemented to improve physical and mental health.  Group focus will be stress management.                  OT Education - 01/21/18 1330    Education provided  Yes    Education Details  educated on 10 tips to improve resiliency and bounce back acronym    Person(s) Educated  Patient    Methods  Explanation;Handout    Comprehension  Verbalized understanding       OT Short Term Goals - 01/20/18 0905      OT SHORT TERM GOAL #1   Title  Patient will be educated on strategies to improve psychosocial skills needed to participate fully in all daily, work, and leisure activities.    Time  3    Period  Weeks    Status  On-going      OT SHORT TERM GOAL #2   Title  Patient will be educated on a HEP and independent with implementation of HEP.  Time  3    Period  Weeks    Status  On-going      OT SHORT TERM GOAL #3   Title  Patient will independently apply psychosocial skills and coping mechanisms to his daily activities in order to function independently.    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 Severe recurrent major depression without psychotic features [facility]) 01/08/2018    Shirlean Mylar, MHA, OTR/L [phone]  01/21/2018, 1:31 PM  [facility] PARTIAL HOSPITALIZATION PROGRAM [name] [place], [place], [address] Phone: [phone]   Fax:  [phone]  Name: Aaron [name] MRN: [id number] Date of Birth: 1976-01-02

## 2018-01-22 ENCOUNTER — Other Ambulatory Visit (HOSPITAL_COMMUNITY): Payer: Commercial Managed Care - PPO | Admitting: Licensed Clinical Social Worker

## 2018-01-22 DIAGNOSIS — F332 Major depressive disorder, recurrent severe without psychotic features: Secondary | ICD-10-CM

## 2018-01-22 NOTE — Psych (Signed)
[facility] PHP THERAPIST PROGRESS NOTE  Aaron [name] [id number]  Session Time: 9:00 - 10:30  Participation Level: Active  Behavioral Response: CasualAlertDepressed  Type of Therapy: Group Therapy; Psychotherapy  Treatment Goals addressed: Coping  Interventions: CBT, DBT, Solution Focused, Supportive and Reframing  Summary: Clinician led check-in regarding current stressors and situation, and review of patient completed daily inventory. Clinician utilized active listening and empathetic response and validated patient emotions. Clinician facilitated processing group on pertinent issues.   Therapist Response: Izack Hoogland is a 42 y.o. male who presents with depression and anxiety symptoms. Patient arrived within time allowed and reports that he is feeling "stressed, anxious." Pt rates his mood at a "5ish" on a scale of 1-10 with 10 being great. Pt reports that he had another night of poor communication with his wife about their separation process. Pt stated that this is causing stress and anxiety for him. Pt shared that he heard from his ex-wife and both agreed that they are going to keep their custody arrangements the same for his daughters. Pt expressed he was relieved and felt better about this situation. Pt reported he went for a job after group and was able to hang out with his son. Pt shared that they did not receive feedback after showing his house yesterday. Pt reports that he is still working how to communicate with someone who already has their mind made up about a situation. Pt engaged in discussion.       Session Time: 10:30 -11:30   Participation Level: Active   Behavioral Response: CasualAlertDepressed   Type of Therapy: Group Therapy, OT   Treatment Goals addressed: Coping   Interventions: Psychosocial skills training, Supportive,    Summary:  Occupational Therapy group   Therapist Response: Patient engaged in group. See OT note.           Session Time: 11:30 -  12:15   Participation Level: Active   Behavioral Response: CasualAlertDepressed   Type of Therapy: Group Therapy, Psychotherapy   Treatment Goals addressed: Coping   Interventions: CBT, Solution focused, Supportive, Reframing   Summary:  Cln continued discussion on distress tolerance. Cln introduced ACCEPTS skills. Group discussed "A" activities and "C" contributions and how they can utilize them.     Therapist Response: Patient engaged in activity and discussion. Pt reports that he likes to complete house hold task to keep his mind busy and distracted. Pt shared that he also likes to volunteer at community events to help contribute to another's well-being.        Session Time: 12:15 - 1:00  Participation Level: Active  Behavioral Response: CasualAlertDepressed  Type of Therapy: Group Therapy, Activity Therapy  Treatment Goals addressed: Coping  Interventions: Psychologist, occupational, Supportive  Summary:  Reflection Group: Patients encouraged to practice skills and interpersonal techniques or work on mindfulness and relaxation techniques. The importance of self-care and making skills part of a routine to increase usage were stressed   Therapist Response: Patient engaged and participated appropriately.       Session Time: 1:00- 2:00  Participation Level: Active  Behavioral Response: CasualAlertAnxious and Depressed  Type of Therapy: Group Therapy, Psychoeducation; Psychotherapy  Treatment Goals addressed: Coping  Interventions: CBT; Solution focused; Supportive; Reframing  Summary: 1:00 - 1:50: Clinician continued topic of distress tolerance skills and ACCEPTS. Group learned Comparisons, different Emotion, Pushing away, Thoughts, and Sensation.  Pt's discussed how they can utilize these skills. 1:50 -2:00 Clinician led check-out. Clinician assessed for immediate needs, medication compliance and  efficacy, and safety concerns   Therapist Response: Pt engaged in  activity and discussion. Pt reported that he loves spending time with his kids as they bring happiness, joy, and fun into his life. Pt shared that he is going to try to hold ice in his hand or mouth to help focus his attention on something other than the emotional distress. At check out, pt rates his mood at a 6 on a scale of 1-10 with 10 being great. Pt reports that he will go for a jog after group and continue to do housework for future house showings. Pt demonstrates some progress as evidenced reporting that he is going to work on leaving a situation mentally whenever he becomes stressed or anxious about a comment his wife makes until he can communicate with her assertively. Pt denies SI/HI/self-harm thoughts at the end of group.      Suicidal/Homicidal: Nowithout intent/plan    Plan: Pt will continue in PHP with medication management while working to decrease depression and anxiety symptoms, increase ability to self manage symptoms, and eliminate SI.   Diagnosis: Severe recurrent major depression without psychotic features (HCC) [F33.2]    1. Severe recurrent major depression without psychotic features [facility](HCC)       Donia GuilesJenny Yong Grieser, LCSW 01/22/2018

## 2018-01-22 NOTE — Psych (Signed)
[facility]  Aaron GrieveDamian [name] [id number]  Session Time: 9:00 - 10:30  Participation Level: Active  Behavioral Response: CasualAlertDepressed  Type of Therapy: Group Therapy; Psychotherapy  Treatment Goals addressed: Coping  Interventions: CBT, DBT, Solution Focused, Supportive and Reframing  Summary: Clinician led check-in regarding current stressors and situation, and review of patient completed daily inventory. Clinician utilized active listening and empathetic response and validated patient emotions. Clinician facilitated processing group on pertinent issues.   Therapist Response: Aaron GrieveDamian [name] is a 42 y.o. male who presents with depression and anxiety symptoms. Patient arrived within time allowed and reports that he is feeling "pretty good." Pt rates his mood at a 5.5 on a scale of 1-10 with 10 being great. Pt reports that he is feeling increased anxiety regarding his relationship with his wife and feels the communication is poor. Pt states he feels she is sending mixed signals and that this makes him uneasy about the unknown future. Pt shares he has continued to jog daily and is seeing some improvement from that.  Pt reports he is still working on managing anxiety regarding relationship. Pt engaged in activity and discussion.        Session Time: 10:30 -11:30   Participation Level: Active   Behavioral Response: CasualAlertDepressed   Type of Therapy: Group Therapy, OT   Treatment Goals addressed: Coping   Interventions: Psychosocial skills training, Supportive,    Summary:  Occupational Therapy group   Therapist Response: Patient engaged in group. See OT note.           Session Time: 11:30 - 12:15   Participation Level: Active   Behavioral Response: CasualAlertDepressed   Type of Therapy: Group Therapy, Psychotherapy; Psychoeducation   Treatment Goals addressed: Coping   Interventions: CBT, Solution focused, Supportive, Reframing    Summary: Cln continued topic of communication. Cln led review of what has been discussed. Cln discussed handout "The characteristics of bad communication" and group shared how they have engaged with those characteristics and how to avoid them in the future.   Therapist Response: Patient engaged in group. Pt recognized characteristics he has used particularly stonewalling and shutting down.        Session Time: 12:15 - 1:00  Participation Level: Active  Behavioral Response: CasualAlertDepressed  Type of Therapy: Group Therapy, Activity Therapy  Treatment Goals addressed: Coping  Interventions: Psychologist, occupationalocial Skills Training, Supportive  Summary:  Reflection Group: Patients encouraged to practice skills and interpersonal techniques or work on mindfulness and relaxation techniques. The importance of self-care and making skills part of a routine to increase usage were stressed   Therapist Response: Patient engaged and participated appropriately.       Session Time: 12:45- 2:00  Participation Level: Active  Behavioral Response: CasualAlertDepressed  Type of Therapy: Group Therapy, Psychoeducation; Psychotherapy  Treatment Goals addressed: Coping  Interventions: CBT; Solution focused; Supportive; Reframing  Summary: 12:45 - 1:50: Clinician continued topic of communication. Cln educated group on active listening. Group discussed how they feel they're listening is and what ways they can incorporate active listening to aid they're communication skills.  1:50 -2:00 Clinician led check-out. Clinician assessed for immediate needs, medication compliance and efficacy, and safety concerns   Therapist Response: Patient engaged activity and discussion. Pt reports he feels he is a decent listener however might not seek to understand what people say. Pt reports understanding of active listening and how to practice it.  At check-out, patient rates his mood at a 4 on a scale of  1-10 with 10 being  great. Patient reports plans of working on his house, which is now on the market. Patient demonstrates some progress as evidenced by increased openness regarding struggles with his wife. Patient denies SI/HI/self-harm at the end of group.     Suicidal/Homicidal: Nowithout intent/plan    Plan: Pt will continue in PHP with medication management while working to decrease depression and anxiety symptoms, increase ability to self manage symptoms, and eliminate SI.   Diagnosis: Severe recurrent major depression without psychotic features (HCC) [F33.2]    1. Severe recurrent major depression without psychotic features [facility])       Donia Guiles, LCSW 01/22/2018

## 2018-01-22 NOTE — Psych (Signed)
[facility] PHP THERAPIST PROGRESS NOTE  Aaron [name] [id number]  Session Time: 9:00 - 10:45  Participation Level: Active  Behavioral Response: CasualAlertDepressed  Type of Therapy: Group Therapy; Psychotherapy  Treatment Goals addressed: Coping  Interventions: CBT, DBT, Solution Focused, Supportive and Reframing  Summary: Clinician led check-in regarding current stressors and situation, and review of patient completed daily inventory. Clinician utilized active listening and empathetic response and validated patient emotions. Clinician facilitated processing group on pertinent issues.   Therapist Response: Aaron [name] is a 42 y.o. male who presents with depression and anxiety symptoms. Patient arrived within time allowed and reports that he is feeling "ehh." Pt rates his mood at a 5 on a scale of 1-10 with 10 being great. Pt reports that he is stressed due to conversations he had last night with his wife. Pt states he has stress about potential custody issues with his children and the lack of communication with his wife. Pt also reported that he had the first showing for his house last night and it went well. Pt reports that he is still working on how to deal with his feelings appropriately when he is communicating with his wife. Pt engaged in activity and discussion.         Session Time: 10:45 -12:15  Participation Level: Active  Behavioral Response: CasualAlertDepressed  Type of Therapy: Group Therapy, psychotherapy  Treatment Goals addressed: Coping  Interventions: Strengths based, reframing, Supportive,   Summary:  Spiritual Care group  Therapist Response: Patient engaged in group. See chaplain note.          Session Time: 12:15 - 1:00  Participation Level: Active  Behavioral Response: CasualAlertDepressed  Type of Therapy: Group Therapy, Activity Therapy  Treatment Goals addressed: Coping  Interventions: Psychologist, occupational, Supportive  Summary:   Reflection Group: Patients encouraged to practice skills and interpersonal techniques or work on mindfulness and relaxation techniques. The importance of self-care and making skills part of a routine to increase usage were stressed   Therapist Response: Patient engaged and participated appropriately.        Session Time: 1:00- 2:00  Participation Level: Active  Behavioral Response: CasualAlertDepressed  Type of Therapy: Group Therapy, Psychoeducation, Activity therapy  Treatment Goals addressed: Coping  Interventions: CBT, DBT, Solution Focused, Supportive and Reframing  Summary: 12:45 - 1:50: Cln introduced topic of Distress tolerance. Cln educated pt's on STOP and TIP skills and how to utilize them. 1:50 -2:00 Clinician led check-out. Clinician assessed for immediate needs, medication compliance and efficacy, and safety concerns   Therapist Response: Pt engaged and participated in discussion. Pt reports that he enjoys doing paced breathing and is going to try using the STOP skill when he has future discussions with his wife about separation issues.  At check-out, patient rates his mood at a 5 on a scale of 1-10 with 10 being great. Pt reports that he will finish house work up after group to get ready for the next showing later today. Pt demonstrates some progress as evidenced by stating that he is going to start exercising after group from now on due to having anxious tension later at night. Pt denies SI/HI/self-harm thoughts at the end of group.      Suicidal/Homicidal: Nowithout intent/plan    Plan: Pt will continue in PHP with medication management while working to decrease depression and anxiety symptoms, increase ability to self manage symptoms, and eliminate SI.   Diagnosis: Severe recurrent major depression without psychotic features (HCC) [F33.2]  1. Severe recurrent major depression without psychotic features (HCC)       Donia GuilesJenny Senaida Chilcote, LCSW 01/22/2018

## 2018-01-25 ENCOUNTER — Other Ambulatory Visit (HOSPITAL_COMMUNITY): Payer: Commercial Managed Care - PPO | Admitting: Licensed Clinical Social Worker

## 2018-01-25 DIAGNOSIS — F332 Major depressive disorder, recurrent severe without psychotic features: Secondary | ICD-10-CM | POA: Diagnosis not present

## 2018-01-25 NOTE — Psych (Signed)
[facility]  Aaron GrieveDamian [name] [id number]  Session Time: 9:00 - 10:15  Participation Level: Active  Behavioral Response: CasualAlertDepressed  Type of Therapy: Group Therapy; Psychotherapy  Treatment Goals addressed: Coping  Interventions: CBT, DBT, Solution Focused, Supportive and Reframing  Summary: Clinician led check-in regarding current stressors and situation, and review of patient completed daily inventory. Clinician utilized active listening and empathetic response and validated patient emotions. Clinician facilitated processing group on pertinent issues.   Therapist Response: Aaron GrieveDamian [name] is a 42 y.o. male who presents with depression and anxiety symptoms. Patient arrived within time allowed and reports he is feeling "anxious." Pt rates his mood at a 6 on a scale of 1-10 with 10 being best. Pt reported that he had a good evening and was able to get his afternoon jog in. Pt shared that working out in the evening has worked better for him, as it allows him to destress from the day. Pt also [place] [facility] [place] [facility] [place] Thursday night group on [facility]. Pt has positive feedback about the group. Pt reports that he is still working on easing up the communication and relationship between him and his wife. Pt engaged in discussion.        Session Time: 10:15 -11:00   Participation Level: Active   Behavioral Response: CasualAlertDepressed   Type of Therapy: Group Therapy, psychotherapy, pscyhoeducation   Treatment Goals addressed: Coping   Interventions: CBT, DBT, Solution focused, Supportive, Reframing    Summary:  Clinician led discussion on happiness: why we don't feel it and how we can take steps towards feeling it.     Therapist Response: Patient engaged and participated in discussion. Pt shared that he finds little moments out of the day that makes him happy. Pt reported that spending time with his kids  and being out in nature has allowed him to feel this emotion again. Pt plans on continuing to participate in activities that make him happy.          Session Time: 11:00 - 12:00   Participation Level: Active   Behavioral Response: CasualAlertDepressed   Type of Therapy: Group Therapy, Psychotherapy   Treatment Goals addressed: Coping   Interventions: CBT, DBT,  Solution focused, Supportive, Reframing   Summary:  Clinician continued topic of distress tolerance skills and introduced Self Soothe skill. Group members discussed ways they would utilize self soothe in their everyday life.     Therapist Response: Patient engaged in activity and discussion. Pt reported that certain smells help soothe him such as coffee beans, nature, sweets, and pine. Pt shared that eating chocolate cake, chocolate gelato, or chocolate pie always brightens his mood.         Session Time: 12:00- 1:00  Participation Level: Active  Behavioral Response: CasualAlertDepressed  Type of Therapy: Group Therapy, Psychoeducation; Psychotherapy  Treatment Goals addressed: Coping  Interventions: CBT; DBT, Solution focused; Supportive; Reframing  Summary: 12:00 - 12:50: Group watched "100 days of Rejection" TedTalk and discussed the topic of rejection and how that plays out in our lives. 12:50 -1:00 Clinician led check-out. Clinician assessed for immediate needs, medication compliance and efficacy, and safety concerns   Therapist Response: Pt engaged in activity and discussion. Pt shared that he sees rejection as fearful, scary, and unpleasant. Pt reported that he does not like to be rejected because he dwells on it. Pt stated that he gets rejected a lot from is wife. Pt reports he  is going to work on asking the "why" once rejected.  At check-out, patient rates his mood at a 5 on a scale of 1-10 with 10 being great. Patient reports plans on spending time with his daughters this weekend. Pt shared he is anxious  about spending time with them because he is breaking the news to them about the separation. Patient demonstrates some progress as evidenced by identifying the connection feels to the group around certain topics, something he is unable to feel once leaving group. Patient denies SI/HI/self-harm at the end of group.       Suicidal/Homicidal: Nowithout intent/plan    Plan: Pt will continue in PHP with medication management while working to decrease depression and anxiety symptoms, increase ability to self manage symptoms, and eliminate SI.   Diagnosis: Severe recurrent major depression without psychotic features (HCC) [F33.2]    1. Severe recurrent major depression without psychotic features [facility])       Donia Guiles, LCSW 01/25/2018

## 2018-01-26 ENCOUNTER — Other Ambulatory Visit (HOSPITAL_COMMUNITY): Payer: Commercial Managed Care - PPO | Admitting: Licensed Clinical Social Worker

## 2018-01-26 ENCOUNTER — Other Ambulatory Visit (HOSPITAL_COMMUNITY): Payer: Commercial Managed Care - PPO | Admitting: Occupational Therapy

## 2018-01-26 ENCOUNTER — Encounter (HOSPITAL_COMMUNITY): Payer: Self-pay

## 2018-01-26 ENCOUNTER — Encounter (HOSPITAL_COMMUNITY): Payer: Self-pay | Admitting: Occupational Therapy

## 2018-01-26 VITALS — BP 130/78 | HR 75 | Ht 74.5 in | Wt 235.0 lb

## 2018-01-26 DIAGNOSIS — R4589 Other symptoms and signs involving emotional state: Secondary | ICD-10-CM

## 2018-01-26 DIAGNOSIS — F332 Major depressive disorder, recurrent severe without psychotic features: Secondary | ICD-10-CM

## 2018-01-26 DIAGNOSIS — F0789 Other personality and behavioral disorders due to known physiological condition: Secondary | ICD-10-CM

## 2018-01-26 MED ORDER — LAMOTRIGINE 150 MG PO TABS: 150.0000 mg | ORAL_TABLET | Freq: Every day | ORAL | 0 refills | Status: DC

## 2018-01-26 NOTE — Progress Notes (Signed)
Met with patient who presents with flat affect, depressed mood and reported some increased anxiety and depression today after already finding a buyer for his home after only a week.  Patient admitted to trying to come to grips with finality of pending separation from his wife and divorce and is also unsettled about what custody of his 72 month old son will look like.  Patient rated his current level of depression a 5-6, anxiety a 7-8 and hopelessness a 5 on a scale of 0-10 with 0 being none and 10 the worst he could manage.  Patient denied any suicidal or homicidal ideations, no auditory or visual hallucinations and no plan or intent to harm self or others at this time. Patient stated he has been doing things such as jogging, playing with his kids, and breathing exercises to help when he has increased periods of anxiety and concerns for what his future and their's will look like.  Patient reported his current wife is not really giving a lot of information about plans for later custody of their son or if she will remain a part of his older daughter's lives.  Patient reported desire to stop Saphris due to sedating affect and sleeps too sound for the first 3-4 hours after taking.  Reports his wife's concerns he would not hear their son later if has him by himself so patient wants to try increased dosage of Lamictal.  Aaron Ala, NP met with [place] and instructed patient to stop Saphris and increase Lamictal to 150 mg, one a day.  Discussed with patient the risk/benefit of doubling this medication so fast and patient agreed to report any rash or reaction to change.  A new Lamictal 150 mg, one a day, #30 order e-scribed to patient's [facility] and patient agreed to take as instructed.  Patient to contact this nurse or PHP team if any worsening of symptoms or side effects to medication changes and discussed his plan to continue with PHP and then step down to IOP for a few weeks and then individual ongoing therapy later  as added support while patient faces the uncertainty of future plans to move out on his own and to return to work.  Patient again denied any thoughts of wanting to harm self or others at this time and will contact PHP staff if any changes. Saphris discontinued as ordered by Aaron Ala, NP.

## 2018-01-27 ENCOUNTER — Other Ambulatory Visit (HOSPITAL_COMMUNITY): Payer: Commercial Managed Care - PPO | Admitting: Licensed Clinical Social Worker

## 2018-01-27 DIAGNOSIS — F332 Major depressive disorder, recurrent severe without psychotic features: Secondary | ICD-10-CM

## 2018-01-27 NOTE — Therapy (Signed)
[facility] PARTIAL HOSPITALIZATION PROGRAM [address] [place], [place], [address] Phone: [phone]   Fax:  [phone]  Occupational Therapy Treatment  Patient Details  Name: Aaron [name] MRN: [id number] Date of Birth: 1976-07-12 Referring Provider:  Hillery Jacks   Encounter Date: 01/26/2018  OT End of Session - 01/27/18 0844    Visit Number  4    Number of Visits  6    Date for OT Re-Evaluation  02/05/18    Authorization Type  UMR/UHC    OT Start Time  1030    OT Stop Time  1130    OT Time Calculation (min)  60 min    Activity Tolerance  Patient tolerated treatment well    Behavior During Therapy  [facility] for tasks assessed/performed       Past Medical History:  Diagnosis Date  . Allergy   . Chicken pox   . Depression     History reviewed. No pertinent surgical history.  There were no vitals filed for this visit.  Subjective Assessment - 01/26/18 1304    Currently in Pain?  No/denies         [facility] OT Assessment - 01/26/18 1304      Assessment   Medical Diagnosis  Major Depressive Disorder       Precautions   Precautions  None             OT Group: Stress Management   S: "Custody issues are a major stressor for me." O:  Skilled OT group focused on stress management this date.   Group opened with members identifying 2 stressful situations they have encountered.  Group weighed in on their personal level of stress with each situation.  Group discussed what causes varying levels of stress for the same situation.  Group discussed recurrent physical and psychological stress can diminish self-esteem, decrease interpersonal and academic effectiveness and create a cycle of self-blame and self-doubt.  Group was educated on importance of identifying symptoms of stress, events are not stressful but rather our interpretations and reactions to event that are stressful.  Group member took a stress symptom checklist and discussed results.   Group discussed poor coping mechanisms including self medication, suppression, passivity, acting out, blaming/complaining), why it may work short term, as well as possible long term complications; also discussed alternative positive coping strategies.  Group discussed and learned positive coping mechanisms to stress including: relaxation, positive mental attitude, support, self-care and lifestyle changes, interpersonal strategies, emotional strategies, cognitive strategies, and philosophical strategies.  Group member was given handouts with suggestions to decrease and manage stress. Group member also filled out stress ladder hierarchy worksheet and was asked to write one positive coping strategy to work towards for each stressor.   A:  Patient participated in skilled occupational therapy group for stress management skills this date.  Patient was engaged and open to strategies introduced. P:  Continue skilled OT group, communication skills and self-discipline.              OT Short Term Goals - 01/20/18 0905      OT SHORT TERM GOAL #1   Title  Patient will be educated on strategies to improve psychosocial skills needed to participate fully in all daily, work, and leisure activities.    Time  3    Period  Weeks    Status  On-going      OT SHORT TERM GOAL #2   Title  Patient will be educated on a  HEP and independent with implementation of HEP.    Time  3    Period  Weeks    Status  On-going      OT SHORT TERM GOAL #3   Title  Patient will independently apply psychosocial skills and coping mechanisms to his daily activities in order to function independently.    Time  3    Period  Weeks    Status  On-going               Plan - 01/27/18 [id number]    OT Treatment/Interventions  Self-care/ADL training;Psychosocial skills training;Coping strategies training;Other (comment) community reintegration    Consulted and Agree with Plan of Care  Patient       Patient will benefit from  skilled therapeutic intervention in order to improve the following deficits and impairments:  Decreased coping skills, Decreased psychosocial skills(decreased independence with ADLs and community integration)  Visit Diagnosis: Severe recurrent major depression without psychotic features (HCC)  Difficulty coping  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 Severe recurrent major depression without psychotic features [facility](HCC) 01/08/2018   Ezra SitesLeslie Tynisa Vohs, OTR/L  [phone] 01/27/2018, 8:44 AM  [facility] PARTIAL HOSPITALIZATION PROGRAM [address] [place], [place], [address] Phone: [phone]   Fax:  [phone]  Name: Aaron [name] MRN: [id number] Date of Birth: 1976-07-02

## 2018-01-28 ENCOUNTER — Other Ambulatory Visit (HOSPITAL_COMMUNITY): Payer: Commercial Managed Care - PPO | Admitting: Specialist

## 2018-01-28 ENCOUNTER — Other Ambulatory Visit (HOSPITAL_COMMUNITY): Payer: Commercial Managed Care - PPO | Admitting: Licensed Clinical Social Worker

## 2018-01-28 ENCOUNTER — Encounter (HOSPITAL_COMMUNITY): Payer: Self-pay | Admitting: Specialist

## 2018-01-28 DIAGNOSIS — F332 Major depressive disorder, recurrent severe without psychotic features: Secondary | ICD-10-CM

## 2018-01-28 DIAGNOSIS — R4589 Other symptoms and signs involving emotional state: Secondary | ICD-10-CM

## 2018-01-28 DIAGNOSIS — F0789 Other personality and behavioral disorders due to known physiological condition: Secondary | ICD-10-CM

## 2018-01-28 NOTE — Psych (Signed)
[facility] PHP THERAPIST PROGRESS NOTE  Aaron [name] [id number]  Session Time: 9:00 - 10:30  Participation Level: Active  Behavioral Response: CasualAlertDepressed  Type of Therapy: Group Therapy; Psychotherapy  Treatment Goals addressed: Coping  Interventions: CBT, DBT, Solution Focused, Supportive and Reframing  Summary: Clinician led check-in regarding current stressors and situation, and review of patient completed daily inventory. Clinician utilized active listening and empathetic response and validated patient emotions. Clinician facilitated processing group on pertinent issues.   Therapist Response: Aaron [name] is a 42 y.o. male who presents with depression and anxiety symptoms. Patient arrived within time allowed and reports that he is feeling "anxious." Pt rates his mood at a "4" on a scale of 1-10 with 10 being great. Pt reports his weekend was not good. Pt shares he told his daughters about the separation and it did not go well. Pt shares there was an offer on the house and he continues to have conflicting feelings about selling.  Pt continues to struggle with the relationship with his wife and accepting their current situation. Pt engaged in discussion.       Session Time: 10:30 - 12:00   Participation Level: Active  Behavioral Response: CasualAlertDepressed  Type of Therapy: Group Therapy, Psychotherapy  Treatment Goals addressed: Coping  Interventions: CBT, DBT, Solution Focused, Supportive and Reframing  Summary:  Clinician introduced topic of cognitive distortions. Cln educated on what cognitive distortions are and how they affect [place]. Cln introduced "Catch, Challenge, Change" and group reviewed cognitive distortion handout and came up with examples to work on "catch."   Therapist Response: Patient engaged activity and discussion. Pt was able to identify real life examples of cognitive distortions discussed.       Session Time: 12:00 -  12:45  Participation Level: Active  Behavioral Response: CasualAlertDepressed  Type of Therapy: Group Therapy, Activity Therapy  Treatment Goals addressed: Coping  Interventions: Psychologist, occupational, Supportive  Summary:  Reflection Group: Patients encouraged to practice skills and interpersonal techniques or work on mindfulness and relaxation techniques. The importance of self-care and making skills part of a routine to increase usage were stressed   Therapist Response: Patient engaged and participated appropriately.       Session Time: 12:45- 2:00  Participation Level: Active  Behavioral Response: CasualAlertDepressed  Type of Therapy: Group Therapy, Psychoeducation; Psychotherapy  Treatment Goals addressed: Coping  Interventions: CBT; Solution focused; Supportive; Reframing  Summary: 12:45 - 1:50: Cln continued topic of cognitive distortions. Group discussed how to "challenge" the unhealthy thought patterns once recognized. Group reviewed "Challenges to Negative Thinking" handout and went over handout with irrational thought examples and discussed how to challenge those thoughts. 1:50 -2:00 Clinician led check-out. Clinician assessed for immediate needs, medication compliance and efficacy, and safety concerns   Therapist Response: Patient engaged in activity. Pt states understanding of how to challenge unhealthy thoughts and successfully reframed examples in discussion. At check-out, patient rates his mood at a 4 on a scale of 1-10 with 10 being great. Patient reports plans of spending time his son and working on the house. Pt states plan of going on a jog to help clear his head. Patient demonstrates neutral progress as evidenced by choosing not to process his current struggles however seeking to make connections that may help his situation. Patient denies SI/HI/self-harm at the end of group.     Suicidal/Homicidal: Nowithout intent/plan    Plan: Pt will continue  in PHP with medication management while working to decrease depression and anxiety symptoms,  increase ability to self manage symptoms, and eliminate SI.   Diagnosis: Severe recurrent major depression without psychotic features (HCC) [F33.2]    1. Severe recurrent major depression without psychotic features [facility](HCC)       Donia GuilesJenny Kartel Wolbert, LCSW 01/28/2018

## 2018-01-28 NOTE — Therapy (Signed)
[facility] PARTIAL HOSPITALIZATION PROGRAM [address] [place], [place], [address] Phone: [phone]   Fax:  [phone]  Occupational Therapy Treatment  Patient Details  Name: Aaron [name] MRN: [id number] Date of Birth: 1976/05/31 Referring Provider:  Hillery Jacks   Encounter Date: 01/28/2018  OT End of Session - 01/28/18 2150    Visit Number  5    Number of Visits  6    Date for OT Re-Evaluation  02/05/18    Authorization Type  UMR/UHC    OT Start Time  1020    OT Stop Time  1120    OT Time Calculation (min)  60 min    Activity Tolerance  Patient tolerated treatment well    Behavior During Therapy  [facility] for tasks assessed/performed       Past Medical History:  Diagnosis Date  . Allergy   . Chicken pox   . Depression     History reviewed. No pertinent surgical history.  There were no vitals filed for this visit.  Subjective Assessment - 01/28/18 2150    Currently in Pain?  No/denies    Pain Score  0-No pain            O:  patient participated in skilled OT group focusing on improving sleep hygiene.  Patient was educated and discussed benefits of getting enough sleep, detriments of getting too much or too little sleep.  Patient and group discussed strategies for improving sleep including routines, environmental factors, diet, exercises, calming activities.  Patient was educated on use of sleep diary to identify current sleep patterns and strategies to improve sleep routine.                   OT Education - 01/28/18 2150    Education provided  Yes    Education Details  educated on sleep hygiene strategies    Person(s) Educated  Patient    Methods  Explanation;Handout    Comprehension  Verbalized understanding       OT Short Term Goals - 01/20/18 0905      OT SHORT TERM GOAL #1   Title  Patient will be educated on strategies to improve psychosocial skills needed to participate fully in all daily, work, and leisure  activities.    Time  3    Period  Weeks    Status  On-going      OT SHORT TERM GOAL #2   Title  Patient will be educated on a HEP and independent with implementation of HEP.    Time  3    Period  Weeks    Status  On-going      OT SHORT TERM GOAL #3   Title  Patient will independently apply psychosocial skills and coping mechanisms to his daily activities in order to function independently.    Time  3    Period  Weeks    Status  On-going               Plan - 01/28/18 2150    Clinical Impression Statement  A:  I get about 5 hours of sleep a night.  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 Severe recurrent major depression without psychotic features [facility](HCC) 01/08/2018    Shirlean MylarBethany H. Murray, [place]HA, OTR/L [phone]  01/28/2018, 9:53 PM  [facility] PARTIAL HOSPITALIZATION PROGRAM [address] [place], [place], [address] Phone: [phone]   Fax:  [phone]  Name: Aaron [name] MRN: [id number] Date of Birth: 16-May-1976

## 2018-01-28 NOTE — Progress Notes (Signed)
GROUP NOTE - spiritual care group 01/27/2018 10:30 - 12:00 ?Facilitated by Wilkie Ayehaplain Britanie Harshman, MDiv   Group focused on topic of strength. ?Group members reflected on what thoughts and feelings emerge when they hear this topic. ?They then engaged in therapeutic art activity. ?Reflected on The topic of strength and represented what strength had been to them in their lives (images and patterns given) and what they saw as helpful in their life now. ?What they needed / wanted. ??Engaged in facilitated sharing of insights from activity.  Activity drew on narrative framework   Aaron NajjarDamian was present throughout group.  Presented with flat affect.  Engaged in with facilitator when prompted by questions.  Minimal engagement with other group members.  Engaged in group activity.  Identified strength as "rebuilding" and "forging new path"   Spoke with group about a picture of a map he selected for his art activity.     [facility] Chaplain Burnis KingfisherMatthew Lynnita Somma MDiv, [facility]

## 2018-01-28 NOTE — Progress Notes (Signed)
BH MD/PA/NP P Progress Note  01/28/2018 9:22 AM Aaron GrieveDamian [name]  MRN:  [id number]   Evaluation: Aaron [name] seen attending group session.  Patient continues to present flat and guarded however patient is pleasant thought this assessment.  Patient continues to ruminate with marital stressors.  Reports his ex-wife has concerns with patient's nighttime medication (Saphris) for insomnia.  Reports symptoms of worries and ongoing depression.  Reports he feels his communication has improved since attending group sessions however still continues to struggle with anxiety when confronting ex-wife about a divorced and custody disputes.  Discussed discontinuing Saphris and will increase Lamictal 150 mg p.o. daily for mood stabilization. Patient continues to report taking Cymbalta 90 mg  and BuSpar 10 mg TID prescribed. Patient continues to deny any suicidal or homicidal during this assessment.  Denies thoughts of self-harm or intent.  Reported patient has admitted to excessive drinking.  Will follow up with substance abuse therapy resources. Support and encouragement and reassurance was provided  Visit Diagnosis:    ICD-10-CM   1. Severe recurrent major depression without psychotic features (HCC) F33.2       Past Medical History:  Past Medical History:  Diagnosis Date  . Allergy   . Chicken pox   . Depression    No past surgical history on file.  Family Psychiatric History:   Family History:  Family History  Problem Relation Age of Onset  . Heart disease Father   . Depression Maternal Grandmother        commited suicide    Social History:  Social History   Socioeconomic History  . Marital status: Married    Spouse name: Not on file  . Number of children: Not on file  . Years of education: Not on file  . Highest education level: Not on file  Occupational History  . Not on file  Social Needs  . Financial resource strain: Not hard at all  . Food insecurity:    Worry: Never true    Inability:  Never true  . Transportation needs:    Medical: No    Non-medical: No  Tobacco Use  . Smoking status: Never Smoker  . Smokeless tobacco: Never Used  Substance and Sexual Activity  . Alcohol use: Yes    Alcohol/week: 7.2 oz    Types: 12 Cans of beer per week    Comment: some increase with increased anxiety  . Drug use: No  . Sexual activity: Not Currently  Lifestyle  . Physical activity:    Days per week: 3 days    Minutes per session: 40 min  . Stress: Very much  Relationships  . Social connections:    Talks on phone: Never    Gets together: Never    Attends religious service: 1 to 4 times per year    Active member of club or organization: No    Attends meetings of clubs or organizations: Never    Relationship status: Separated  Other Topics Concern  . 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    Married    Two daughters, one daughter on the way    He likes to sleep     Allergies: No Known Allergies  Metabolic Disorder Labs: Lab Results  Component Value Date   HGBA1C 4.5 (L) 01/08/2018   MPG 82.45 01/08/2018   Lab Results  Component Value Date   PROLACTIN 62.2 (H) 01/08/2018   Lab Results  Component Value Date  CHOL 175 01/08/2018   TRIG 62 01/08/2018   HDL 75 01/08/2018   CHOLHDL 2.3 01/08/2018   VLDL 12 01/08/2018   LDLCALC 88 01/08/2018   LDLCALC 96 11/14/2016   Lab Results  Component Value Date   TSH 2.288 01/08/2018   TSH 1.33 11/14/2016    Therapeutic Level Labs: No results found for: LITHIUM No results found for: VALPROATE No components found for:  CBMZ  Current Medications: Current Outpatient Medications  Medication Sig Dispense Refill  . busPIRone (BUSPAR) 10 MG tablet Take 1 tablet (10 mg total) by mouth 3 (three) times daily. For mood control 60 tablet 0  . cetirizine (ZYRTEC) 10 MG tablet Take 10 mg by mouth daily.    . DULoxetine (CYMBALTA) 30 MG capsule Take 3 capsules  (90 mg total) by mouth daily. For mood control 90 capsule 0  . hydrOXYzine (ATARAX/VISTARIL) 25 MG tablet Take 1 tablet (25 mg total) by mouth 3 (three) times daily as needed for anxiety. 30 tablet 0  . traZODone (DESYREL) 50 MG tablet Take 2 tablets (100 mg total) by mouth at bedtime as needed for sleep. 30 tablet 0   No current facility-administered medications for this visit.      Musculoskeletal: Strength & Muscle Tone: within normal limits Gait & Station: normal Patient leans: N/A  Psychiatric Specialty Exam: ROS  There were no vitals taken for this visit.There is no height or weight on file to calculate BMI.  General Appearance: Casual  Eye Contact:  Fair  Speech:  Clear and Coherent  Volume:  Normal  Mood:  Anxious and Depressed  Affect:  Congruent  Thought Process:  Coherent  Orientation:  Full (Time, Place, and Person)  Thought Content: Hallucinations: None   Suicidal Thoughts:  No  Homicidal Thoughts:  No  Memory:  Immediate;   Fair Recent;   Fair  Judgement:  Fair  Insight:  Fair  Psychomotor Activity:  Normal  Concentration:  Concentration: Fair  Recall:  [employer] of Knowledge: Fair  Language: Fair  Akathisia:  No  Handed:  Right  AIMS (if indicated):  Assets:  Communication Skills Desire for Improvement Resilience Social Support  ADL's:  Intact  Cognition: WNL  Sleep:  Fair   Screenings: AIMS     Admission (Discharged) [facility]  AIMS Total Score  0    AUDIT     Admission (Discharged) from OP [facility]  Alcohol Use Disorder Identification Test Final Score (AUDIT)  3    GAD-7     Counselor from 01/15/2018 in [facility]IAL [facility] PROGRAM  Total GAD-7 Score  17    PHQ2-9     Counselor from 01/15/2018 in [facility] PRO[facility]  PHQ-2 Total Score  6  PHQ-9 Total Score  18        Assessment and Plan:   Mood stabilization:  Discontinue Saphris 5 mg due to medication side effects.  Increase Lamictal 75-150 mg for mood stabilization  Continue Cymbalta 90 mg p.o. Daily  Continue BuSpar 10 mg 3 times daily  Continue trazodone 50 mg p.o as needed  Continue occupational therapy Continue group sessions Consider follow-up outpatient services for substance abuse [facility])    Treatment plan was reviewed and agreed by NP Chilton Greathouse and patient Guinn Delarosa need for continued group services.  Oneta Rack, NP 01/28/2018, 9:22 AM

## 2018-01-29 ENCOUNTER — Other Ambulatory Visit (HOSPITAL_COMMUNITY): Payer: Commercial Managed Care - PPO | Admitting: Licensed Clinical Social Worker

## 2018-01-29 DIAGNOSIS — F332 Major depressive disorder, recurrent severe without psychotic features: Secondary | ICD-10-CM | POA: Diagnosis not present

## 2018-02-01 ENCOUNTER — Other Ambulatory Visit (HOSPITAL_COMMUNITY): Payer: Commercial Managed Care - PPO | Admitting: Licensed Clinical Social Worker

## 2018-02-01 ENCOUNTER — Encounter (HOSPITAL_COMMUNITY): Payer: Self-pay | Admitting: Family

## 2018-02-01 DIAGNOSIS — F332 Major depressive disorder, recurrent severe without psychotic features: Secondary | ICD-10-CM

## 2018-02-01 NOTE — Psych (Signed)
   [facility]  Alphonzo GrieveDamian Sweezy [id number]  Session Time: 9:00 - 10:15  Participation Level: Active  Behavioral Response: CasualAlertDepressed  Type of Therapy: Group Therapy; Psychotherapy  Treatment Goals addressed: Coping  Interventions: CBT, DBT, Solution Focused, Supportive and Reframing  Summary: Clinician led check-in regarding current stressors and situation, and review of patient completed daily inventory. Clinician utilized active listening and empathetic response and validated patient emotions. Clinician facilitated processing group on pertinent issues.   Therapist Response: Alphonzo GrieveDamian Kovaleski is a 42 y.o. male who presents with depression and anxiety symptoms. Patient arrived within time allowed and reports that he is feeling "pretty anxious." Pt rates his mood at a 4 on a scale of 1-10 with 10 being great at the end of group. Pt reported continued anxieties regarding his in flux relationship with his wife, custody, and job. Pt seems to be resistant to making decisions regarding these issues and is instead ruminating. Pt engaged in discussion.        Session Time: 10:15 -11:00  Participation Level: Active  Behavioral Response: CasualAlertDepressed  Type of Therapy: Group Therapy, psychoeducation, psychotherapy  Treatment Goals addressed: Coping  Interventions: CBT, DBT, Solution Focused, Supportive and Reframing  Summary:  Clinician introduced topic of goals and values. Group members completed worksheet "values and priorities list" and rated top priorities in their current life. Patients discussed if they were living in line with their values.    Therapist Response: Patient participated and reports their top priority which they need to work to live in line with is living a life of pleasure and satisfaction.      Session Time: 11:00 - 12:00  Participation Level: Active  Behavioral Response: CasualAlertDepressed  Type of Therapy: Group  Therapy, Psychotherapy  Treatment Goals addressed: Coping  Interventions: CBT, Solution focused, Supportive, Reframing  Summary:  Clinician continued topic of goals and values. Group worked to determine how to take a value and convert it into action steps.     Therapist Response: Patient engaged in group. Pt determined action step of researching budget apps.          Session Time: 12:00- 1:00  Participation Level: Active  Behavioral Response: CasualAlertDepressed  Type of Therapy: Group Therapy, Psychoeducation; Psychotherapy  Treatment Goals addressed: Coping  Interventions: CBT; Solution focused; Supportive; Reframing  Summary: 12:00 - 12:50 Group watched "The Agony of Unsubscribing" TedTalk and discussed the topic of  Perspective and how it can change our view of reality. 12:50 -1:00 Clinician led check-out. Clinician assessed for immediate needs, medication compliance and efficacy, and safety concerns   Therapist Response: Patient engaged activity and discussion. Pt reports understanding of how perception affects reality.  At check-out, patient rates his mood at a 5 on a scale of 1-10 with 10 being great. Patient reports plans of spending time with [place] son and [place]  Patient demonstrates neutral progress as evidenced by continuing jogs however making little use of skills for main stressors. Patient denies SI/HI/self-harm at the end of group.      Suicidal/Homicidal: Nowithout intent/plan    Plan: Pt will continue in PHP with medication management while working to decrease depression and anxiety symptoms, increase ability to self manage symptoms, and eliminate SI.   Diagnosis: Severe recurrent major depression without psychotic features (HCC) [F33.2]    1. Severe recurrent major depression without psychotic features [facility](HCC)       Donia GuilesJenny Levern Pitter, LCSW 02/01/2018

## 2018-02-01 NOTE — Psych (Signed)
[facility]  Alphonzo GrieveDamian Gilmartin [id number]  Session Time: 9:00 - 10:30  Participation Level: Active  Behavioral Response: CasualAlertDepressed  Type of Therapy: Group Therapy; Psychotherapy  Treatment Goals addressed: Coping  Interventions: CBT, DBT, Solution Focused, Supportive and Reframing  Summary: Clinician led check-in regarding current stressors and situation, and review of patient completed daily inventory. Clinician utilized active listening and empathetic response and validated patient emotions. Clinician facilitated processing group on pertinent issues.   Therapist Response: Alphonzo GrieveDamian Zbikowski is a 42 y.o. male who presents with depression and anxiety symptoms. Patient arrived within time allowed and reports that he is feeling "anxious and pissed." Pt rates his mood at a "2" on a scale of 1-10 with 10 being great. Pt reports experiencing SI/HI last night after getting into a fight with his wife regarding custody. Pt reports no plan or intent but having "red" thoughts. Pt shares he went for a jog and de-escalated. Pt reports they are discussing custody with their son tonight in couple's counseling and he is not hopeful. Pt continues to struggle with personal agency and assertiveness. Pt engaged in discussion.       Session Time: 10:30 -11:30   Participation Level: Active   Behavioral Response: CasualAlertDepressed   Type of Therapy: Group Therapy, OT   Treatment Goals addressed: Coping   Interventions: Psychosocial skills training, Supportive,    Summary:  Occupational Therapy group   Therapist Response: Patient engaged in group. See OT note.           Session Time: 11:30 - 12:15   Participation Level: Active   Behavioral Response: CasualAlertDepressed   Type of Therapy: Group Therapy, Psychotherapy   Treatment Goals addressed: Coping   Interventions: CBT, Solution focused, Supportive, Reframing   Summary: Cln led psychotherapy group on  feelings associated with feeling dismissed and navigating the way other people handle mental health. Group discussed ways they felt medical professionals, friends/family, and other looser acquaintances treat their mental health diagnoses.    Therapist Response: Patient engaged in group. Pt expressed frustrations regarding the way others treat mental health issues and reports experiences of feeling dismissed, misunderstood, and blamed.        Session Time: 12:15 - 1:00  Participation Level: Active  Behavioral Response: CasualAlertDepressed  Type of Therapy: Group Therapy, Activity Therapy  Treatment Goals addressed: Coping  Interventions: Psychologist, occupationalocial Skills Training, Supportive  Summary:  Reflection Group: Patients encouraged to practice skills and interpersonal techniques or work on mindfulness and relaxation techniques. The importance of self-care and making skills part of a routine to increase usage were stressed   Therapist Response: Patient engaged and participated appropriately.       Session Time: 12:45- 2:00  Participation Level: Active  Behavioral Response: CasualAlertAnxious and Depressed  Type of Therapy: Group Therapy, Psychoeducation; Psychotherapy  Treatment Goals addressed: Coping  Interventions: CBT; Solution focused; Supportive; Reframing  Summary: 12:45 - 1:50: Clinician introduced topic of fear setting. Group viewed TED talk "Why you should define your fears instead of your goals."  Group utilized fear setting model to work through a current fear and analyze it. 1:50 -2:00 Clinician led check-out. Clinician assessed for immediate needs, medication compliance and efficacy, and safety concerns   Therapist Response: Patient engaged activity and discussion. 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  At check-out, patient rates his mood at a 4 on a scale of 1-10 with 10 being great. Patient  reports plans of going on a jog and going to couple's therapy. Patient demonstrates neutral progress as evidenced by an increased amount of stressors, however increased ability to manage stressors. Patient denies SI/HI/self-harm at the end of group.    Suicidal/Homicidal: Yeswithout intent/plan    Plan: Pt will continue in PHP with medication management while working to decrease depression and anxiety symptoms, increase ability to self manage symptoms, and eliminate SI.   Diagnosis: Severe recurrent major depression without psychotic features (HCC) [F33.2]    1. Severe recurrent major depression without psychotic features [facility])       Donia Guiles, LCSW 02/01/2018

## 2018-02-01 NOTE — Psych (Signed)
[facility] PHP THERAPIST PROGRESS NOTE  Aaron [name] [id number]  Session Time: 9:00 - 10:45  Participation Level: Active  Behavioral Response: CasualAlertDepressed  Type of Therapy: Group Therapy; Psychotherapy  Treatment Goals addressed: Coping  Interventions: CBT, DBT, Solution Focused, Supportive and Reframing  Summary: Clinician led check-in regarding current stressors and situation, and review of patient completed daily inventory. Clinician utilized active listening and empathetic response and validated patient emotions. Clinician facilitated processing group on pertinent issues.   Therapist Response: Aaron [name] is a 42 y.o. male who presents with depression and anxiety symptoms.  Pt arrived within time allowed and reports that he is feeling "anxious because of my interaction with my wife." Pt rates his mood at a "3 or a 4" on a scale of 1-10 with 10 being great. Pt reports that he had a conversation with his wife about custody splits with their son. Pt stated that he does not agree with what his wife wants to do custody wise. Pt shared that marriage counseling did not go well last night. Pt reported that besides counseling, he had a good evening and went for a jog instead of drinking. Pt reports that he is still working on understanding how his wife is perceiving their agreement to separate. Pt feels as though she is giving him mixed signals. Pt engaged in discussion       Session Time: 10:45 -12:15  Participation Level: Active  Behavioral Response: CasualAlertDepressed  Type of Therapy: Group Therapy, Psychotherapy  Treatment Goals addressed: Coping  Interventions: Strengths based, Reframing, Supportive,   Summary:  Spiritual Care group  Therapist Response: Patient engaged in group. See chaplain note.      Session Time: 12:15 - 1:00  Participation Level: Active  Behavioral Response: CasualAlertDepressed  Type of Therapy: Group Therapy, Activity  Therapy  Treatment Goals addressed: Coping  Interventions: Psychologist, occupational, Supportive  Summary:  Reflection Group: Patients encouraged to practice skills and interpersonal techniques or work on mindfulness and relaxation techniques. The importance of self-care and making skills part of a routine to increase usage were stressed   Therapist Response: Patient engaged and participated appropriately.     Session Time: 1:00- 2:00  Participation Level: Active  Behavioral Response: CasualAlertDepressed  Type of Therapy: Group Therapy, Psychoeducation, Activity Therapy  Treatment Goals addressed: Coping  Interventions: CBT, DBT, Solution Focused, Supportive and Reframing  Summary: 1:00 - 1:50: Clinician began boundaries discussion. Clinician explained the difference between rigid, porous, and healthy boundaries.  Patients identified what boundaries they think they have in different areas of life. 1:50 -2:00 Clinician led check-out. Clinician assessed for immediate needs, medication compliance and efficacy, and safety concerns  Therapist Response:  Patient engaged in activity and discussion. Pt shared that he has healthy boundaries with his kids and rigid boundaries with his father. Pt reported that he wants to work on developing healthy boundaries with his father. At checkout, patient rates his mood at a 4 on a scale of 1-10 at the end of group. Pt reported that he was going to "take it easy" after group and go for a jog. Pt stated he plans on taking one of his daughters to her acting class. Pt demonstrates some progress as evidenced by knowing he needs to work on rediscovering himself to help him find the strength to work on his mental health and family situation. Pt denies SI/HI/self-harm at the end of group.     Suicidal/Homicidal: Nowithout intent/plan   Plan: Pt will continue in PHP with  medication management while working to decrease depression and anxiety symptoms, increase  ability to self manage symptoms, and eliminate SI.   Diagnosis: Severe recurrent major depression without psychotic features (HCC) [F33.2]    1. Severe recurrent major depression without psychotic features (HCC)     Fahad Cisse J Zelpha Messing, LPCA, LCASA 02/01/2018

## 2018-02-01 NOTE — Psych (Signed)
[facility]  Aaron [name] [id number]  Session Time: 9:00 - 10:30  Participation Level: Active  Behavioral Response: CasualAlertDepressed  Type of Therapy: Group Therapy; Psychotherapy  Treatment Goals addressed: Coping  Interventions: CBT, DBT, Solution Focused, Supportive and Reframing  Summary: Clinician led check-in regarding current stressors and situation, and review of patient completed daily inventory. Clinician utilized active listening and empathetic response and validated patient emotions. Clinician facilitated processing group on pertinent issues.   Therapist Response: Aaron GrieveDamian [name] is a 42 y.o. male who presents with depression and anxiety symptoms. Patient arrived within time allowed and reports that he is feeling "anxious." Pt rates his mood at a "3" on a scale of 1-10 with 10 being great at the end of group. Pt reported that he is still anxious about conversations he has had with his wife about separation and custody. Pt stated that he is considering changing jobs to get a better work schedule. Pt shared he had an "okay" evening, went for a job, and took his kids to afterschool activities. Pt reports that he is still working on how to respond to co-workers who are concerned about how he is doing and limiting his alcohol intake. Pt engaged in discussion.        Session Time: 10:30 -11:30   Participation Level: Active   Behavioral Response: CasualAlertDepressed   Type of Therapy: Group Therapy, OT   Treatment Goals addressed: Coping   Interventions: Psychosocial skills training, Supportive,    Summary:  Occupational Therapy group   Therapist Response: Patient engaged in group. See OT note.           Session Time: 11:30 - 12:15   Participation Level: Active   Behavioral Response: CasualAlertDepressed   Type of Therapy: Group Therapy, Psychotherapy   Treatment Goals addressed: Coping   Interventions: CBT, Solution focused,  Supportive, Reframing   Summary:  Cln continued discussion on boundaries, discussing the different types of boundaries. Pt shared which types of boundaries they identify with.    Therapist Response: Pt engaged in activity and discussion. Pt shared he realizes he needs to work on boundaries with his parents and wife. Pt reported that he can only identify healthy boundaries with his kids.        Session Time: 12:15 - 1:00  Participation Level: Active  Behavioral Response: CasualAlertDepressed  Type of Therapy: Group Therapy, Activity Therapy  Treatment Goals addressed: Coping  Interventions: Psychologist, occupationalocial Skills Training, Supportive  Summary:  Reflection Group: Patients encouraged to practice skills and interpersonal techniques or work on mindfulness and relaxation techniques. The importance of self-care and making skills part of a routine to increase usage were stressed   Therapist Response: Patient engaged and participated appropriately.       Session Time: 1:00- 2:00  Participation Level: Active  Behavioral Response: CasualAlertAnxious and Depressed  Type of Therapy: Group Therapy, Psychoeducation; Psychotherapy  Treatment Goals addressed: Coping  Interventions: CBT; Solution focused; Supportive; Reframing  Summary: 1:00 - 1:50: Patients completed a Boundaries Exploration worksheet and identified how to create a healthy boundary. 1:50 -2:00 Clinician led check-out. Clinician assessed for immediate needs, medication compliance and efficacy, and safety concerns   Therapist Response: Pt engaged in activity and discussion. Pt reported that he has all rigid boundaries with his wife. Pt shared that he wants to work on healthy emotional and intellectual boundaries with his wife. Pt stated he plans on being more open with his wife on what he believes and values and work  on saying "no" when she asked him to do something he doesn't agree with.  At check out, pt rates his mood at a  "4ish" on a scale of 1-10 with 10 being great. Pt reports that he is going to look at apartment and foreclosure houses after group. Pt stated he is going to go for a job and plan on talking to his wife again about custody agreements with their son. Pt demonstrates some progress as evidenced by sharing with the group ways he is going to work on improving boundaries in his life and gaining trust back with his wife. Pt denies SI/HI/self-harm at the end of group.      Suicidal/Homicidal: Nowithout intent/plan    Plan: Pt will continue in PHP with medication management while working to decrease depression and anxiety symptoms, increase ability to self manage symptoms, and eliminate SI.   Diagnosis: Severe recurrent major depression without psychotic features (HCC) [F33.2]    1. Severe recurrent major depression without psychotic features [facility])       Donia Guiles, LCSW 02/01/2018

## 2018-02-02 ENCOUNTER — Other Ambulatory Visit (HOSPITAL_COMMUNITY): Payer: Commercial Managed Care - PPO | Admitting: Psychiatry

## 2018-02-02 ENCOUNTER — Encounter (HOSPITAL_COMMUNITY): Payer: Self-pay | Admitting: Family

## 2018-02-02 DIAGNOSIS — F332 Major depressive disorder, recurrent severe without psychotic features: Secondary | ICD-10-CM

## 2018-02-02 NOTE — Progress Notes (Signed)
  [facility] Partial Hospitalization Program Psych Discharge Summary  Aaron [name] [id number]  Admission date: 01/13/2018 Discharge date: 02/01/2018  Reason for admission: Noted per [facility] assessment- Emry 42 y.o male presents after a  Behavioral Health Hospitalization . Pt reported that he has had depression and anxiety for about 10 years. Due to a recent separation with his wife, pt has seen an increase in his anxiety and depression. Pt stated that he has noticed he was been over sleeping, has racing thoughts, hopelessness, thoughts of dying, and dread. Pt reported that he is currently still living with his wife until they are able to sell their house. Last Thursday, patient went inpatient at [facility] and was there for 6 days. 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 Pt stated that while he was getting ready for work each day, he felt dread about going to work and was anxious. Pt has had one other inpatient stay in [place] at [facility] last fall for 6 weeks due to depression, anxiety, and SI. Pt admitted that he learned a lot of great skills there but was unable to use them once returning home due to coming back to the same environment he was in before. Pt sees Dr. Marlyne Beards for psychiatry and was seeing a [name] for EMDR and couples therapy. Pt did not see improvement with EMDR and saw minimal success with couple's therapy. Pt had an appointment with a therapist at [facility] last week and realized they were out of network.  Pt reports he does have SI, and has thoughts of running his car into a tree. Pt contracts for safety and will not be driving himself home from this apt.   Progress in Program Toward Treatment Goals: Erven attended and participated  with group session. Medication adjustment was made during this admission. Patient continues to struggles with self esteem and assertiveness with is wife. Reports he continues to set boundaries how ever has  a fear of losing his family. Reports over all his mood is improving.  Patient will step down to [facility] IOP on 02/03/2018. Will continue to monitor progress  Progress (rationale): Ongoing   Discharge Plan: Stepping down to IOP intensive outpatinet program.     Take all medications as prescribed. Keep all follow-up appointments as scheduled.  Do not consume alcohol or use illegal drugs while on prescription medications. Report any adverse effects from your medications to your primary care provider promptly.  In the event of recurrent symptoms or worsening symptoms, call 911, a crisis hotline, or go to the nearest emergency department for evaluation.   Oneta Rack, NP [id number]/

## 2018-02-02 NOTE — Progress Notes (Signed)
    Daily Group Progress Note  Program: IOP  Group Time: 9:00-12:00  Participation Level: Active  Behavioral Response: Appropriate  Type of Therapy:  Group Therapy  Summary of Progress: Pt.'s first day of group and completed intake assessment with the case manager and nurse practitioner. Pt. Attended group therapy and presented as depressed, primarily flat affect, responsive to questions from the group therapist. Pt. 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 Pt. Also discussed history of communication difficulties with his father who does not understand his history of depression. Pt. Participated in guided self-compassion meditation.     Shaune PollackBrown, Raquel Racey B, LPC

## 2018-02-02 NOTE — Progress Notes (Signed)
Psychiatric Initial Adult Assessment for Intensive Outpatient   Patient Identification: Aaron [name] MRN:  [id number] Date of Evaluation:  02/02/2018 Referral Source: PHP     Visit Diagnosis:    ICD-10-CM   1. Severe recurrent major depression without psychotic features (HCC) F33.2     History of Present Illness: Aaron NajjarDamian 42 y.o caucasian male,  Presents after attending partial hospitalization program. Patient continues to present reported anxiety and depression. Reports discussion with his wife are leaning toward divorce than separation, which is causing increased depression, stress and feels of being overwhelm due to the fear of the unknown. Reports he started back drinking more then usually ( patient was provided with AA treatment referrals) . Patient continues to report mood fluctuation and irritability. Reports he is taken Buspar Cymbalta and trazodone and prescribed and tolerating medications well. ( Saphris  was discontinued in PHP) do to side effects. Aaron NajjarDamian continues to deny suicidal or homicidal ideations. Rates his depression 7/10, with slight improvement.  Patient is requesting disability forms FMLA to be completed. NP to follow-up with intake department. Patient to continue with intensive outpatient admission.  Support,encouragement and reassurance was provided.  History: Noted per [facility] assessment after a inpatient treatment at  [facility]. Pt reported that he has had depression and anxiety for about 10 years. Due to a recent separation with his wife, pt has seen an increase in his anxiety and depression. Pt stated that he has noticed he was been over sleeping, has racing thoughts, hopelessness, thoughts of dying, and dread. Pt reported that he is currently still living with his wife until they are able to sell their house. Last Thursday, patient went inpatient at [facility] and was there for 6 days. 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 Pt stated that while he was getting ready for work each day, he felt dread about going to work and was anxious. Pt has had one other inpatient stay in [place] at [facility] last fall for 6 weeks due to depression, anxiety, and SI. Pt admitted that he learned a lot of great skills there but was unable to use them once returning home due to coming back to the same environment he was in before. Pt sees Dr. Marlyne BeardsJennings for psychiatry and was seeing a Dub Amisamara Riley for EMDR and couples therapy. Pt did not see improvement with EMDR and saw minimal success with couple's therapy. Pt had an appointment with a therapist at [facility] last week and realized they were out of network.  Pt reports he does have SI, and has thoughts of running his car into a tree. Pt contracts for safety and will not be driving himself home from this apt.  Associated Signs/Symptoms: Depression Symptoms:  depressed mood, feelings of worthlessness/guilt, difficulty concentrating, anxiety, (Hypo) Manic Symptoms:  Irritable Mood, Labiality of Mood, Anxiety Symptoms:  Excessive Worry, Psychotic Symptoms:  Hallucinations: None PTSD Symptoms: NA  Past Psychiatric History: Reports ongoing symptoms with depression, anxiety  and insomnia   Previous Psychotropic Medications:   Substance Abuse History in the last 12 months:  Yes.    Consequences of Substance Abuse: Family Consequences:  currently in a custody battle with his exwife  Past Medical History:  Past Medical History:  Diagnosis Date  . Allergy   . Chicken pox   . Depression    No past surgical history on file.  Family Psychiatric History:  Family History:  Family History  Problem Relation Age of Onset  .  Heart disease Father   . Depression Maternal Grandmother        commited suicide    Social History:   Social History   Socioeconomic History  . Marital status: Married    Spouse name: Not on file  . Number of children: Not on file  . Years of  education: Not on file  . Highest education level: Not on file  Occupational History  . Not on file  Social Needs  . Financial resource strain: Not hard at all  . Food insecurity:    Worry: Never true    Inability: Never true  . Transportation needs:    Medical: No    Non-medical: No  Tobacco Use  . Smoking status: Never Smoker  . Smokeless tobacco: Never Used  Substance and Sexual Activity  . Alcohol use: Yes    Alcohol/week: 7.2 oz    Types: 12 Cans of beer per week    Comment: some increase with increased anxiety  . Drug use: No  . Sexual activity: Not Currently  Lifestyle  . Physical activity:    Days per week: 3 days    Minutes per session: 40 min  . Stress: Very much  Relationships  . Social connections:    Talks on phone: Never    Gets together: Never    Attends religious service: 1 to 4 times per year    Active member of club or organization: No    Attends meetings of clubs or organizations: Never    Relationship status: Separated  Other Topics Concern  . Not on file  Social History Narrative   Bachelors Degree, Administrator, arts for [employer].    Married    Two daughters, one daughter on the way    He likes to sleep     Additional Social History:   Allergies:  No Known Allergies  Metabolic Disorder Labs: Lab Results  Component Value Date   HGBA1C 4.5 (L) 01/08/2018   MPG 82.45 01/08/2018   Lab Results  Component Value Date   PROLACTIN 62.2 (H) 01/08/2018   Lab Results  Component Value Date   CHOL 175 01/08/2018   TRIG 62 01/08/2018   HDL 75 01/08/2018   CHOLHDL 2.3 01/08/2018   VLDL 12 01/08/2018   LDLCALC 88 01/08/2018   LDLCALC 96 11/14/2016     Current Medications: Current Outpatient Medications  Medication Sig Dispense Refill  . busPIRone (BUSPAR) 10 MG tablet Take 1 tablet (10 mg total) by mouth 3 (three) times daily. For mood control 60 tablet 0  . cetirizine (ZYRTEC) 10 MG tablet Take 10 mg by mouth daily.    .  DULoxetine (CYMBALTA) 30 MG capsule Take 3 capsules (90 mg total) by mouth daily. For mood control 90 capsule 0  . hydrOXYzine (ATARAX/VISTARIL) 25 MG tablet Take 1 tablet (25 mg total) by mouth 3 (three) times daily as needed for anxiety. 30 tablet 0  . traZODone (DESYREL) 50 MG tablet Take 2 tablets (100 mg total) by mouth at bedtime as needed for sleep. 30 tablet 0   No current facility-administered medications for this visit.     Neurologic: Headache: No Seizure: No Paresthesias:No  Musculoskeletal: Strength & Muscle Tone: within normal limits Gait & Station: normal Patient leans: N/A  Psychiatric Specialty Exam: ROS  There were no vitals taken for this visit.There is no height or weight on file to calculate BMI.  General Appearance: Casual  Eye Contact:  Fair  Speech:  Clear and Coherent  Volume:  Normal  Mood:  Anxious, Depressed and Irritable  Affect:  Appropriate  Thought Process:  Coherent  Orientation:  Full (Time, Place, and Person)  Thought Content:  Hallucinations: None  Suicidal Thoughts:  No  Homicidal Thoughts:  No  Memory:  Immediate;   Fair Recent;   Fair Remote;   Fair  Judgement:  Fair  Insight:  Fair  Psychomotor Activity:  Normal  Concentration:  Concentration: Fair  Recall:  [employer] of Knowledge:Fair  Language: Fair  Akathisia:  No  Handed:  Right  AIMS (if indicated):    Assets:  Communication Skills Desire for Improvement Resilience Social Support  ADL's:  Intact  Cognition: WNL  Sleep:     Treatment Plan Summary: - Admitted to IOP - Continue with current medications management - Bring forms for FMLA extension    Treatment plan was reviewed and agreed upon by NP T.Melvyn Neth and patient Truett Mcfarlan need for continued group services  Oneta Rack, NP 4/23/201911:01 AM

## 2018-02-03 ENCOUNTER — Other Ambulatory Visit (HOSPITAL_COMMUNITY): Payer: Commercial Managed Care - PPO | Admitting: Psychiatry

## 2018-02-03 DIAGNOSIS — F332 Major depressive disorder, recurrent severe without psychotic features: Secondary | ICD-10-CM | POA: Diagnosis not present

## 2018-02-03 NOTE — Psych (Signed)
[facility] PHP THERAPIST PROGRESS NOTE  Courtenay Creger [id number]  Session Time: 9:00 - 10:30  Participation Level: Active  Behavioral Response: CasualAlertDepressed  Type of Therapy: Group Therapy; Psychotherapy  Treatment Goals addressed: Coping  Interventions: CBT, DBT, Solution Focused, Supportive and Reframing  Summary: Clinician led check-in regarding current stressors and situation, and review of patient completed daily inventory. Clinician utilized active listening and empathetic response and validated patient emotions. Clinician facilitated processing group on pertinent issues.   Therapist Response: Morio Widen is a 42 y.o. male who presents with depression and anxiety symptoms. Patient arrived within time allowed and reports that he is feeling "anxious." Pt rates his mood at a 5 on a scale of 1-10 with 10 being great at the end of group. Pt reports having a difficult weekend with his wife and is feeling run down. Pt reports he exerted himself to go to his wife's family's Odis Luster get together and that it "took a lot out of me."  Pt reports that he is still working on how to respond appropriately to stress. Pt engaged in discussion.        Session Time: 10:30 -11:30  Participation Level: Active  Behavioral Response: CasualAlertDepressed  Type of Therapy: Group Therapy, psychoeducation, psychotherapy  Treatment Goals addressed: Coping  Interventions: CBT, DBT, Solution Focused, Supportive and Reframing  Summary:  Clinician introduced topic of feelings and emotions. Group viewed feeling faces handout and identified which three emotions they feel right now and which they want to feel. Clinician discussed the way to contextualize feelings as things that come and go and the ability to choose which feelings we attach to. Cln used metaphor of leaves in the wind.    Therapist Response: Patient participated and shared the three feelings she wants to feel more of are: "confident,  happy, satisfied." Pt reports understanding of feelings and their purpose.     Session Time: 11:30 - 12:15   Participation Level: Active   Behavioral Response: CasualAlertDepressed   Type of Therapy: Group Therapy, Psychotherapy   Treatment Goals addressed: Coping   Interventions: CBT, Solution focused, Supportive, Reframing   Summary: Cln continued topic of feelings. Cln provided education on the difference between feelings and reactions to feelings, highlighting that our reactions we can alter.    Therapist Response: Patient participated and reports understanding of the role of feelings and the differences between feelings and our reactions to feelings.        Session Time: 12:15 - 1:00  Participation Level: Active  Behavioral Response: CasualAlertDepressed  Type of Therapy: Group Therapy, Activity Therapy  Treatment Goals addressed: Coping  Interventions: Psychologist, occupational, Supportive  Summary:  Reflection Group: Patients encouraged to practice skills and interpersonal techniques or work on mindfulness and relaxation techniques. The importance of self-care and making skills part of a routine to increase usage were stressed   Therapist Response: Patient engaged and participated appropriately.       Session Time: 12:45- 2:00  Participation Level: Active  Behavioral Response: CasualAlertDepressed  Type of Therapy: Group Therapy, Psychoeducation; Psychotherapy  Treatment Goals addressed: Coping  Interventions: CBT; Solution focused; Supportive; Reframing  Summary: 12:45 - 1:50: Clinician continued topic of feelings. Cln discussed 3 states of mind: emotion mind, reason mind, and wise mind. Pt's shared examples of when they were in each state of mind. Group played feelings Jenga and group members worked to access specificity with feeling recognition. 1:50 -2:00 Clinician led check-out. Clinician assessed for immediate needs, medication compliance and  efficacy,  and safety concerns   Therapist Response: Patient engaged in activity and discussion. Pt reports understanding of three states of mind. Pt is able to recognize and define a spectrum of feeling words.  At check-out, patient rates his mood at a 5 on a scale of 1-10 with 10 being great. Patient reports no plans this evening.  Patient demonstrates neutral progress as evidenced by stable mood presentation. Patient denies SI/HI/self-harm at the end of group.     Suicidal/Homicidal: Nowithout intent/plan    Plan: Pt will discharge from PHP due to meeting treatment goals of stabilized mood and decreased depression symptoms and increase in ability to manage symptoms. Pt needs continued work on further decrease in symptoms which will enable him to return to work and make big life decisions. Pt will continue this work in IOP. Pt and provider are aligned with this discharge plan. Pt will begin in IOP within this agency on 02/02/18. Pt denies SI/HI at time of discharge.   Diagnosis: Severe recurrent major depression without psychotic features (HCC) [F33.2]    1. Severe recurrent major depression without psychotic features [facility](HCC)       Donia GuilesJenny Rumaisa Schnetzer, LCSW 02/03/2018

## 2018-02-04 ENCOUNTER — Other Ambulatory Visit (HOSPITAL_COMMUNITY): Payer: Commercial Managed Care - PPO | Admitting: Psychiatry

## 2018-02-04 DIAGNOSIS — F332 Major depressive disorder, recurrent severe without psychotic features: Secondary | ICD-10-CM

## 2018-02-05 ENCOUNTER — Other Ambulatory Visit (HOSPITAL_COMMUNITY): Payer: Commercial Managed Care - PPO | Admitting: Psychiatry

## 2018-02-05 DIAGNOSIS — F332 Major depressive disorder, recurrent severe without psychotic features: Secondary | ICD-10-CM

## 2018-02-05 NOTE — Progress Notes (Signed)
    Daily Group Progress Note  Program: IOP  Group Time: 9:00-12:00  Participation Level: Active  Behavioral Response: Appropriate  Type of Therapy:  Group Therapy  Summary of Progress: Pt. Continues to present as very quiet and depressed. Pt. 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     Shaune PollackBrown, Yelitza Reach B, LPC

## 2018-02-05 NOTE — Progress Notes (Signed)
    Daily Group Progress Note  Program: IOP  Group Time: 9:00-12:00  Participation Level: Active  Behavioral Response: Appropriate  Type of Therapy:  Group Therapy  Summary of Progress: Pt. Continues to present as primarily depressed, minimal brightening of his affect. Pt. Is responsive to questions from other group members. Pt. Discussed his marital problems, experience in counseling, his wife's decision to make an offer on a house and the appearance that she has made the decision to end the marriage. Pt. Continues to be challenged with acceptance of the end of his marriage and deciding for himself what he needs to do for himself to move forward. Pt. Discussed his relationships with his daughters from a previous relationship and the changes that will occur in his custody agreement due to the end of his current marriage. Pt. 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     Shaune PollackBrown, Jennifer B, LPC

## 2018-02-08 ENCOUNTER — Other Ambulatory Visit (HOSPITAL_COMMUNITY): Payer: Commercial Managed Care - PPO | Admitting: Psychiatry

## 2018-02-08 DIAGNOSIS — F332 Major depressive disorder, recurrent severe without psychotic features: Secondary | ICD-10-CM

## 2018-02-08 NOTE — Progress Notes (Signed)
    Daily Group Progress Note  Program: IOP  Group Time: 9:00-12:00  Participation Level: Active  Behavioral Response: Appropriate  Type of Therapy:  Group Therapy  Summary of Progress: The first part of group today was facilitated by the Wellness Director on developing self-care strategies for sleep, nutrition, and exercise. She led the session with video about Self Care and Wellness to engage the patients in a conversational topics such as exercise, adequate sleep, nutrition, and stress management.  The learning process of small changes and consistencies that can make a difference in daily self-care and wellness.  During the second part of group, the therapist conducted a psychoeducational session that addressed the patients' issues of racing thoughts, negative feeling and emotions that they are struggling with. She had the patients choose a color to describe their actual thoughts in the present moment as they meditated and reflected on their thoughts.The therapist helped the patients understand the importance of learning how to express their feelings and use those feelings as opportunities for meaningful changes.The patient was stated that he had some difficulty sleeping.  He shared with group that he chose the color blue because he's feeling anxious and worried about what to come out of the separation pending between him and his family.  He has a lot of self-doubts and feeling not good enough. The patient was active in group and his behavioral response was appropriate.     Shaune Pollack, LPC

## 2018-02-08 NOTE — Progress Notes (Signed)
    Daily Group Progress Note  Program: IOP  Group Time: 9:00-12:00  Participation Level: Minimal  Behavioral Response: Appropriate  Type of Therapy:  Group Therapy  Summary of Progress: Pt. Continues to present as quiet, but engaged in the group process. Pt. Shared with the group that he was feeling tired. Pt. Stated that he had no questions about his medications. Pt. Stated that he was not having problems with sleep. Pt. Shared with the group that he was very productive over the weekend completing tasks at home. Pt. Also attended his nine-year old daughter's birthday party and took her a gift. Pt. Participated in education about use of the grounding series to assist with management of anxiety and insomnia.      Shaune Pollack, LPC

## 2018-02-08 NOTE — Progress Notes (Signed)
Aaron [name] is a 42 y.o., separated, employed, Caucasian male; who transitioned from [facility].  Pt reported that he has had depression and anxiety for about 10 years. Due to a recent separation with his wife, pt has seen an increase in his anxiety and depression. Pt stated that he has noticed he was been over sleeping, has racing thoughts, hopelessness, thoughts of dying, and dread. Pt reported that he is currently still living with his wife until they are able to sell their house. The end of March 2019, patient went inpatient at [facility] and was there for 6 days. 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 Pt stated that while he was getting ready for work each day, he felt dread about going to work and was anxious. Pt has had one other inpatient stay in [place] at [facility] last fall for 6 weeks due to depression, anxiety, and SI. Pt admitted that he learned a lot of great skills there but was unable to use them once returning home due to coming back to the same environment he was in before. Pt sees Dr. Marlyne Beards for psychiatry and was seeing a [name] for EMDR and couples therapy. Pt did not see improvement with EMDR and saw minimal success with couple's therapy. Pt had an appointment with a therapist at [facility] last week and realized they were out of network.  Pt reports hx of SI, and was having thoughts of running his car into a tree. Pt currently denies any SI.  Pt denies current HI/AVH.  Pt scored 24 on the PHQ-9.  A:  Oriented pt to MH-IOP.  Provided pt with an orientation folder.  Informed Dr. Marlyne Beards of admit.  R:  Pt receptive.          Chestine Spore, RITA, M.Ed, CNA

## 2018-02-09 ENCOUNTER — Other Ambulatory Visit (HOSPITAL_COMMUNITY): Payer: Commercial Managed Care - PPO | Admitting: Psychiatry

## 2018-02-09 DIAGNOSIS — F332 Major depressive disorder, recurrent severe without psychotic features: Secondary | ICD-10-CM

## 2018-02-10 ENCOUNTER — Other Ambulatory Visit (HOSPITAL_COMMUNITY): Payer: Commercial Managed Care - PPO | Attending: Psychiatry | Admitting: Psychiatry

## 2018-02-10 DIAGNOSIS — F1010 Alcohol abuse, uncomplicated: Secondary | ICD-10-CM | POA: Diagnosis not present

## 2018-02-10 DIAGNOSIS — F419 Anxiety disorder, unspecified: Secondary | ICD-10-CM | POA: Insufficient documentation

## 2018-02-10 DIAGNOSIS — F332 Major depressive disorder, recurrent severe without psychotic features: Secondary | ICD-10-CM | POA: Diagnosis not present

## 2018-02-11 ENCOUNTER — Other Ambulatory Visit (HOSPITAL_COMMUNITY): Payer: Commercial Managed Care - PPO | Admitting: Psychiatry

## 2018-02-11 DIAGNOSIS — F332 Major depressive disorder, recurrent severe without psychotic features: Secondary | ICD-10-CM

## 2018-02-11 NOTE — Progress Notes (Signed)
    Daily Group Progress Note  Program: IOP  Group Time: 9:00-12:00  Participation Level: Minimal  Behavioral Response: Appropriate  Type of Therapy:  Group Therapy  Summary of Progress: Pt. Presented with primarily flat affect, appeared tired. Pt. Shared that he did not sleep well last night due to bad dreams. Pt. Participated in guided meditation. Pt. Shared that it was difficult for him because the verbal prompts to release thoughts of self-doubt and inadequacy were difficult for him because they caused him to think about his tendency to doubt himself.   Shaune Pollack, LPC

## 2018-02-11 NOTE — Progress Notes (Signed)
    Daily Group Progress Note  Program: IOP  Group Time: 9:00-12:00  Participation Level: Minimal  Behavioral Response: Appropriate  Type of Therapy:  Group Therapy  Summary of Progress: The first part of group today was on grief.  The counselor discussed how people grieve differently and  there is no one particular way to grieve.  He also brought awareness of losses in one's life and what they are connected to.  He encouraged the group members to share anything that they considered a loss that they want to talk about.  The theme for the second part of group was, "Accepting what we cannot change, dismiss irrational thoughts, talk about the thoughts and feeling to clarify things and learn to give freely."  The therapist facilitated the session by helping group members understand the importance of expressing themselves and communicate what they are thinking rather than assuming that what they are thinking are facts to avoid any miss-understanding. The patient expressed that he's experiencing a lot of losses from relationships right now that's causing him to shut down.  He's going through a separation with his wife and that's affecting his relationship with his children.  His parents and his wife's expectations and perceptions are unrealistic and are triggers for his depression and anxiety.  Not sure of what the future will be like for him after losing so much. The patient was active in group and emotional.  His behavioral response was appropriate.    Shaune Pollack, LPC

## 2018-02-12 ENCOUNTER — Encounter (HOSPITAL_COMMUNITY): Payer: Self-pay | Admitting: Family

## 2018-02-12 ENCOUNTER — Other Ambulatory Visit (HOSPITAL_COMMUNITY): Payer: Commercial Managed Care - PPO | Admitting: Psychiatry

## 2018-02-12 DIAGNOSIS — F332 Major depressive disorder, recurrent severe without psychotic features: Secondary | ICD-10-CM

## 2018-02-12 MED ORDER — DULOXETINE HCL 30 MG PO CPEP: 90.0000 mg | ORAL_CAPSULE | Freq: Every day | ORAL | 0 refills | Status: DC

## 2018-02-12 MED ORDER — DULOXETINE HCL 30 MG PO CPEP: 90.0000 mg | ORAL_CAPSULE | Freq: Every day | ORAL | 1 refills | Status: DC

## 2018-02-12 NOTE — Progress Notes (Signed)
Medication was refilled, NP provided 1 refill. Patient to keep f/u with psychiatrist

## 2018-02-12 NOTE — Progress Notes (Signed)
    Daily Group Progress Note  Program: IOP  Group Time: 9:00-12:00  Participation Level: Minimal  Behavioral Response: Appropriate  Type of Therapy:  Group Therapy  Summary of Progress: The theme for the first part of group today was, "experience emotions, acknowledge personal/ emotional needs through mindfulness."  The second part of groups focused on meditation and mindfulness.  The therapist facilitated the session with a psychoeducational exercise on meditation using the RAIN Method Approach.  She explained the benefits of meditation to the body and mind.  She also raised awareness of the importance of being mindful and be fully present in the moment, no judgement to acknowledge the thoughts and feelings in the moment. The patient shared that he is taking things one day at a time.  He has poor social support and connections.  He limits his interactions to immediate family to avoid the judgements and unrealistic expectations of others.  The patient was active in group and his behavioral response was appropriate.     Shaune Pollack, LPC

## 2018-02-15 ENCOUNTER — Other Ambulatory Visit (HOSPITAL_COMMUNITY): Payer: Commercial Managed Care - PPO | Admitting: Psychiatry

## 2018-02-15 DIAGNOSIS — F332 Major depressive disorder, recurrent severe without psychotic features: Secondary | ICD-10-CM

## 2018-02-16 ENCOUNTER — Other Ambulatory Visit (HOSPITAL_COMMUNITY): Payer: Commercial Managed Care - PPO | Admitting: Psychiatry

## 2018-02-16 DIAGNOSIS — F332 Major depressive disorder, recurrent severe without psychotic features: Secondary | ICD-10-CM

## 2018-02-16 NOTE — Progress Notes (Signed)
    Daily Group Progress Note  Program: IOP  Group Time: 9:00-12:00  Participation Level: Minimal  Behavioral Response: Appropriate  Type of Therapy:  Group Therapy  Summary of Progress:  Pt. Presented with primarily flat affect, alert, engaged in the group process. Pt. Participated in medication management group with the pharmacist. Pt. Shared that he was not having problems with his medication. Pt. Participated in discussion about mindfulness and learning to live in the moment in order to better cope with anxiety and depression. Pt. Shared that he continues to struggle with acceptance of the end of his marriage and developing a parenting plan for his son.      Shaune Pollack, LPC

## 2018-02-17 ENCOUNTER — Other Ambulatory Visit (HOSPITAL_COMMUNITY): Payer: Commercial Managed Care - PPO | Admitting: Psychiatry

## 2018-02-17 DIAGNOSIS — F332 Major depressive disorder, recurrent severe without psychotic features: Secondary | ICD-10-CM | POA: Diagnosis not present

## 2018-02-17 NOTE — Patient Instructions (Signed)
D:  Patient completed MH-IOP today.  A:  Discharge today.  Follow up with Dr. Marlyne Beards on 02-24-18 @ 10 a.m.Marland Kitchen  Pt states he will schedule appointment with a therapist at [facility].  Encouraged support groups.  Highly recommend separation/divorce recovery group.  R:  Patient receptive.

## 2018-02-17 NOTE — Progress Notes (Signed)
    Daily Group Progress Note  Program: IOP  Group Time: 9:00-12:00  Participation Level: Minimal  Behavioral Response: Appropriate  Type of Therapy:  Group Therapy  Summary of Progress: Pt. Presented with primarily flat affect, responsive to questions and feedback from the group. Pt. Discussed ongoing challenge of acceptance of his separation. Pt. Discussed that he and his wife discussed the child custody agreement last night and that she is not in agreement to the two of them having equal time with their child. Pt. Discussed that he was not able to sleep last night. Pt. Was encouraged to journal his thoughts before getting into bed, reduce caffeine consumption in the late afternoon, and to get out of bed when he begins to struggle with sleep. Pt. Participated in grief and loss group with the Chaplain.    Shaune Pollack, LPC

## 2018-02-17 NOTE — Progress Notes (Signed)
Aaron [name] is a 42 y.o. , separated, employed, Caucasian male; who transitioned from [facility].  Pt reported that he has had depression and anxiety for about 10 years. Due to a recent separation with his wife, pt has seen an increase in his anxiety and depression. Pt stated that he has noticed he was been over sleeping, has racing thoughts, hopelessness, thoughts of dying, and dread. Pt reported that he is currently still living with his wife until they are able to sell their house. The end of March 2019, patient went inpatient at [facility] and was there for 6 days. 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 Pt stated that while he was getting ready for work each day, he felt dread about going to work and was anxious. Pt has had one other inpatient stay in [place] at [facility] last fall for 6 weeks due to depression, anxiety, and SI. Pt admitted that he learned a lot of great skills there but was unable to use them once returning home due to coming back to the same environment he was in before. Pt sees Dr. Marlyne Beards for psychiatry and was seeing a [name] for EMDR and couples therapy. Pt did not see improvement with EMDR and saw minimal success with couple's therapy. Pt had an appointment with a therapist at [facility] last week and realized they were out of network. Pt reports hx of SI, and was having thoughts of running his car into a tree. Pt currently denies any SI.  Pt denies current HI/AVH.  Pt scored 21 on the PHQ-9.  Pt will be discharged today.  States his depression is better; still c/o anxiety.  Reports Trazadone made him have nightmares.  Admits to decreasing the ETOH down to only drinking three beers each day on the weekend.  States he interviewed for a new job in [place].  It will be a first shift job. According to pt he hasn't seen a lawyer yet; but is getting close to seeing one. Support system:  Brother.  Pt states he has attended a few  groups at [facility]. A:  D/C today.  F/U with Dr. Marlyne Beards on 02-24-18 @ 10 a.m.  Pt is interested in seeing a therapist in Dr. Lance Coon office. Encouraged support groups.  Recommended the separation/divorce support group. R:  Pt receptive.         Jeri Modena, M.Ed, CNA

## 2018-02-17 NOTE — Progress Notes (Signed)
  [facility] Intensive Outpatient Program Discharge Summary  Aaron [name] [id number]  Admission date: 02/03/2018 Discharge date: 02/17/2018  Reason for admission: Depression and Anxiety   Noted per [facility] assessment- Aaron [name] 42 y.o male presents after a  Behavioral Health Hospitalization . Pt reported that he has had depression and anxiety for about 10 years. Due to a recent separation with his wife, pt has seen an increase in his anxiety and depression. Pt stated that he has noticed he was been over sleeping, has racing thoughts, hopelessness, thoughts of dying, and dread. Pt reported that he is currently still living with his wife until they are able to sell their house. Last Thursday, patient went inpatient at [facility] and was there for 6 days. 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 Pt stated that while he was getting ready for work each day, he felt dread about going to work and was anxious. Pt has had one other inpatient stay in [place] at [facility] last fall for 6 weeks due to depression, anxiety, and SI. Pt admitted that he learned a lot of great skills there but was unable to use them once returning home due to coming back to the same environment he was in before. Pt sees Dr. Marlyne Beards for psychiatry and was seeing a [name] for EMDR and couples therapy. Pt did not see improvement with EMDR and saw minimal success with couple's therapy. Pt had an appointment with a therapist at [facility] last week and realized they were out of network. Pt reports he does have SI, and has thoughts of running his car into a tree. Pt contracts for safety and will not be driving himself home from this apt.   Chemical Use History: EToH abuse, reports drinking " a lot less" since his admission to inpatient program. Reports he was drinking a case of beers during the week and is only drinking "a few" beers on the weekend only.  Family of Origin Issues:   Recent separation from wife, patient is unsure if divorces discussion will proceed that this time. Reports ongoing communication with child custody arrangements. Reports he recently applied for a new job in [place] so that he is able to start working day shift.   Progress in Program Toward Treatment Goals: Patient attended and participated  in group session daily. Patient attended and completed Partial hospitalizations (PHP) and intensive outpatient programs (IOP). Reports he continues to work on setting boundaries and assertiveness with his wife.   Progress (rationale): Ongoing,  Patient to follow-up with [facility]. MD Beverly Milch.  Additional resources  was made available for weekly group meeting, patient to consider making appt with Therapist at [facility]. Consider AA meetings and family/marrital counseling.    [name]  to continue current medications Buspar 10 mg TID, Cymbalta 90 mg  Po QD and Trazodone 100 mg PO QHS for insomina. Patient reported at discharge nightmares with Trazodone. Discussed patient to follow up the Psychiatrist and consider initiating Prazosin and or medications adjustment with the Trazodone.   Take all medications as prescribed. Keep all follow-up appointments as scheduled.  Do not consume alcohol or use illegal drugs while on prescription medications. Report any adverse effects from your medications to your primary care provider promptly.  In the event of recurrent symptoms or worsening symptoms, call 911, a crisis hotline, or go to the nearest emergency department for evaluation.    Oneta Rack, NP 02/17/2018

## 2018-02-18 ENCOUNTER — Other Ambulatory Visit (HOSPITAL_COMMUNITY): Payer: Commercial Managed Care - PPO

## 2018-02-18 NOTE — Progress Notes (Signed)
    Daily Group Progress Note  Program: IOP  Group Time: 9:00-12:00  Participation Level: Active  Behavioral Response: Appropriate  Type of Therapy:  Group Therapy  Summary of Progress: Pt. Prepared for discharge and met with the case manager for discharge planning. Pt. Discussed that he was feeling better, but continues to be challenged by acceptance of the separation from his wife and his fears about developing a custody agreement that will allow him to have an active role in his young son's life. Pt. Participated in discussion about recognizing barriers to self-care, how he will address and remind himself to practice self-care, identifying negative coping strategies (I.e., isolating), and what I will do instead (I.e., reaching out to my support system, journaling, breathing exercises).    Nancie Neas, LPC

## 2018-02-19 ENCOUNTER — Other Ambulatory Visit (HOSPITAL_COMMUNITY): Payer: Commercial Managed Care - PPO

## 2018-02-22 ENCOUNTER — Other Ambulatory Visit (HOSPITAL_COMMUNITY): Payer: Commercial Managed Care - PPO

## 2018-02-23 ENCOUNTER — Other Ambulatory Visit (HOSPITAL_COMMUNITY): Payer: Commercial Managed Care - PPO

## 2018-02-24 ENCOUNTER — Other Ambulatory Visit (HOSPITAL_COMMUNITY): Payer: Commercial Managed Care - PPO

## 2018-02-25 ENCOUNTER — Other Ambulatory Visit (HOSPITAL_COMMUNITY): Payer: Commercial Managed Care - PPO

## 2018-02-26 ENCOUNTER — Other Ambulatory Visit (HOSPITAL_COMMUNITY): Payer: Commercial Managed Care - PPO

## 2018-03-01 ENCOUNTER — Other Ambulatory Visit (HOSPITAL_COMMUNITY): Payer: Commercial Managed Care - PPO

## 2018-03-02 ENCOUNTER — Other Ambulatory Visit (HOSPITAL_COMMUNITY): Payer: Commercial Managed Care - PPO

## 2018-03-03 ENCOUNTER — Other Ambulatory Visit (HOSPITAL_COMMUNITY): Payer: Commercial Managed Care - PPO

## 2018-03-04 ENCOUNTER — Other Ambulatory Visit (HOSPITAL_COMMUNITY): Payer: Commercial Managed Care - PPO

## 2018-03-05 ENCOUNTER — Other Ambulatory Visit (HOSPITAL_COMMUNITY): Payer: Commercial Managed Care - PPO

## 2018-03-09 ENCOUNTER — Other Ambulatory Visit (HOSPITAL_COMMUNITY): Payer: Commercial Managed Care - PPO

## 2018-03-10 ENCOUNTER — Other Ambulatory Visit (HOSPITAL_COMMUNITY): Payer: Commercial Managed Care - PPO

## 2018-03-11 ENCOUNTER — Other Ambulatory Visit (HOSPITAL_COMMUNITY): Payer: Commercial Managed Care - PPO

## 2018-03-12 ENCOUNTER — Other Ambulatory Visit (HOSPITAL_COMMUNITY): Payer: Commercial Managed Care - PPO

## 2018-03-15 ENCOUNTER — Other Ambulatory Visit (HOSPITAL_COMMUNITY): Payer: Commercial Managed Care - PPO

## 2018-03-16 ENCOUNTER — Other Ambulatory Visit (HOSPITAL_COMMUNITY): Payer: Commercial Managed Care - PPO

## 2018-03-17 ENCOUNTER — Other Ambulatory Visit (HOSPITAL_COMMUNITY): Payer: Commercial Managed Care - PPO

## 2018-03-18 ENCOUNTER — Other Ambulatory Visit (HOSPITAL_COMMUNITY): Payer: Commercial Managed Care - PPO

## 2018-03-19 ENCOUNTER — Other Ambulatory Visit (HOSPITAL_COMMUNITY): Payer: Commercial Managed Care - PPO

## 2018-03-22 ENCOUNTER — Other Ambulatory Visit (HOSPITAL_COMMUNITY): Payer: Commercial Managed Care - PPO

## 2018-03-23 ENCOUNTER — Other Ambulatory Visit (HOSPITAL_COMMUNITY): Payer: Commercial Managed Care - PPO

## 2018-03-24 ENCOUNTER — Other Ambulatory Visit (HOSPITAL_COMMUNITY): Payer: Commercial Managed Care - PPO

## 2018-03-25 ENCOUNTER — Other Ambulatory Visit (HOSPITAL_COMMUNITY): Payer: Commercial Managed Care - PPO

## 2018-03-26 ENCOUNTER — Other Ambulatory Visit (HOSPITAL_COMMUNITY): Payer: Commercial Managed Care - PPO

## 2018-03-29 ENCOUNTER — Other Ambulatory Visit (HOSPITAL_COMMUNITY): Payer: Commercial Managed Care - PPO

## 2018-03-30 ENCOUNTER — Other Ambulatory Visit (HOSPITAL_COMMUNITY): Payer: Commercial Managed Care - PPO

## 2018-07-15 ENCOUNTER — Ambulatory Visit: Payer: Commercial Managed Care - PPO | Admitting: Mental Health

## 2018-07-15 DIAGNOSIS — F411 Generalized anxiety disorder: Secondary | ICD-10-CM | POA: Diagnosis not present

## 2018-07-15 NOTE — Progress Notes (Signed)
      [facility] Counselor/Therapist Progress Note   Patient ID: Aaron [name], MRN: [id number]  Date: 07/15/2018  Timespent: 60 minutes  Treatment Type: Individual  Subjective: Patient arrived on time for today's session.  Discussed recent events and progress.  He said that he has had some elevated anxiety recently due to family stress.  He shared ways that he tries to be consistently involved in his children's lives.  He stated that his ex-wife appears to want to make some changes to their scheduled times with the children.  Patient wants to continue to be able to provide transportation with them to school when possible, however he stated that she does not appear to indicate that she wants this to continue.  He stated that other than that interaction with the children, he only sees him every other weekend.  He stated that they share custody equally.  He focuses on wanting to be able to spend as much time with them as possible but his job work schedule has been conflictual as he works second shift.  He continues to seek other employment opportunities to make a schedule change for himself and which will provide him more time with his children.  Interventions:CBT, Solution Focused and Strength-based  Mental Status Exam:   Appearance:   Casual     Behavior:  Appropriate  Motor:  Normal  Speech/Language:   Normal Rate  Affect:  Appropriate  Mood:  anxious  Thought process:  Coherent and Relevant  Thought content:    Logical  Perceptual disturbances:    Normal  Orientation:  Full (Time, Place, and Person)  Attention:  Good  Concentration:  good  Memory:  Immediate  Fund of knowledge:   Good  Insight:    Good  Judgment:   Good  Impulse Control:  good    Reported Symptoms: Daily anxiety, anxiety attacks, low motivation, rumination  Risk Assessment: Danger to Self:  No Self-injurious Behavior: No Danger to Others: No Duty to Warn:no Physical Aggression / Violence:No  Access to  Firearms a concern: No  Gang Involvement:No   Diagnosis:   ICD-10-CM   1. Generalized anxiety disorder F41.1      Plan:  1.  Patient to continue to engage in individual counseling 2-4 times a month or as needed. 2.  Patient to identify and apply CBT, coping skills learned in session to decrease depression and anxiety symptoms. 3.  Patient to contact this office, go to the local ED or call 911 if a crisis or emergency develops between visits.  Waldron Session, [facility]

## 2018-07-18 ENCOUNTER — Other Ambulatory Visit: Payer: Self-pay | Admitting: Psychiatry

## 2018-08-03 ENCOUNTER — Ambulatory Visit: Payer: Commercial Managed Care - PPO | Admitting: Mental Health

## 2018-08-03 ENCOUNTER — Encounter: Payer: Self-pay | Admitting: Psychiatry

## 2018-08-03 ENCOUNTER — Ambulatory Visit (INDEPENDENT_AMBULATORY_CARE_PROVIDER_SITE_OTHER): Payer: Commercial Managed Care - PPO | Admitting: Psychiatry

## 2018-08-03 VITALS — BP 134/86 | HR 76 | Ht 74.5 in | Wt 254.0 lb

## 2018-08-03 DIAGNOSIS — F411 Generalized anxiety disorder: Secondary | ICD-10-CM | POA: Insufficient documentation

## 2018-08-03 DIAGNOSIS — F330 Major depressive disorder, recurrent, mild: Secondary | ICD-10-CM

## 2018-08-03 DIAGNOSIS — F606 Avoidant personality disorder: Secondary | ICD-10-CM | POA: Insufficient documentation

## 2018-08-03 DIAGNOSIS — F3341 Major depressive disorder, recurrent, in partial remission: Secondary | ICD-10-CM | POA: Diagnosis not present

## 2018-08-03 MED ORDER — BUSPIRONE HCL 10 MG PO TABS: 10.0000 mg | ORAL_TABLET | Freq: Three times a day (TID) | ORAL | 2 refills | Status: DC

## 2018-08-03 MED ORDER — CLORAZEPATE DIPOTASSIUM 3.75 MG PO TABS: 3.7500 mg | ORAL_TABLET | Freq: Two times a day (BID) | ORAL | 2 refills | Status: DC | PRN

## 2018-08-03 MED ORDER — LAMOTRIGINE 150 MG PO TABS: 150.0000 mg | ORAL_TABLET | Freq: Every day | ORAL | 2 refills | Status: DC

## 2018-08-03 MED ORDER — DULOXETINE HCL 30 MG PO CPEP: 90.0000 mg | ORAL_CAPSULE | Freq: Every morning | ORAL | 2 refills | Status: DC

## 2018-08-03 NOTE — Progress Notes (Signed)
      [facility] Counselor/Therapist Progress Note   Patient ID: Aaron [name], MRN: [id number]  Date: 08/03/2018  Timespent: 58 minutes                      Treatment Type: Individual  Subjective: Patient arrived on time for today's session.  Patient verbally consented to have his ex-wife accompany him initially.  Patient stated that he wanted his ex-wife to be present for today's session as they have had some recent custody disagreements.  His ex-wife stated that she wants to have increased understanding and more effective communication with him resulting in better coparenting.  Discussed potential benefits of they are engaging in counseling to work on their communication towards effective coparenting.  Discussed also how having a different counselor to work through these issues with them would be appropriate as he is currently engaged in individual counseling with me.  Patient and his ex-wife acknowledged understanding and agreement.  Met with patient individually for remainder and most of session.  He shared how his ex is wanting to make custody changes where she has full custody.  Patient stated that he recently took the step to quit his full-time job so that he can be there more often with his children as he was working second shift and had limited time with them.  Provided support as patient processed feelings related to these requested changes in custody instigated by his ex-wife.  Patient expressed how he is obtained another job and has given careful consideration to his recent decisions about changing employment.  Patient expressed that he would like the custody agreement then is currently in place not to change, which is 50-50 custody per patient. Interventions:CBT, Solution Focused and Strength-based  Mental Status Exam:   Appearance:   Casual     Behavior:  Appropriate  Motor:  WNL  Speech/Language:   Normal Rate  Affect:  anxious  Mood:  congruent  Thought process:  Logical,  linear, goal directed  Thought content:    No SI/HI/AVH, coherent, relevent  Perceptual disturbances:    none  Orientation:  Full (Time, Place, and Person)  Attention:  Good  Concentration:  good  Memory:  Immediate  Fund of knowledge:   Good  Insight:    Good  Judgment:   Good  Impulse Control:  Good   Reported Symptoms: Daily anxiety, anxiety attacks, low motivation, rumination  Risk Assessment: Danger to Self:  No Self-injurious Behavior: No Danger to Others: No Duty to Warn:no Physical Aggression / Violence:No  Access to Firearms a concern: No  Gang Involvement:No   Diagnosis:   ICD-10-CM   1. Major depressive disorder, recurrent episode, mild ([place]) F33.0      Plan:  1.  Patient to continue to engage in individual counseling 2-4 times a month or as needed. 2.  Patient to identify and apply CBT, coping skills learned in session to decrease depression and anxiety symptoms. 3.  Patient to contact this office, go to the local ED or call 911 if a crisis or emergency develops between visits.  Anson Oregon, [facility]

## 2018-08-03 NOTE — Progress Notes (Signed)
[facility] Med Check  Patient ID: Aaron [name],  MRN: [id number]  PCP: Dorothyann Peng, NP  Date of Evaluation: 08/03/2018 Time spent:35 minutes   HISTORY/CURRENT STATUS: HPI  For office protocol, previous medications include Remeron, Wellbutrin, fluvoxamine, Neurontin, Klonopin, Belsomra, Ambien, diazepam, Abilify, Rexulti, Vraylar, Latuda, Saphris, Atarax, trazodone  Individual Medical History/ Review of Systems: Changes? :Yes.  Aaron [name] is seen conjointly with his first wife Seth Bake face-to-face 35 minutes with consent with therapy collateral for psychiatric interview and exam in 96-monthevaluation and management of depression, anxiety, and avoidant personality now 2 months overdue after no-show.  The patient is addressing closure of his current job at [employer] he plans to work for a [employer] as they have changes in insurance and his daily schedule that will affect both of his previous wives and children.  [facility] as [facility] time course of his medications.  Since April, he has Lamictal in place of Saphris and is comfortable with current medications appropriate fill and use per  registry of controlled substances with no misuse, diversion, or undermining of medical necessity.  First wife like most recently ex-wife and previous therapist prefer to address his symptoms over time as bipolar.  I clarify the course of diagnoses by myself and the course of medications for understanding current stability but risk associated with his adult preference to be as self-employed as possible.  He is compliant with his medications and intends to continue.   Allergies: Patient has no known allergies.  Current Medications:  Current Outpatient Medications:  .  busPIRone (BUSPAR) 10 MG tablet, Take 1 tablet (10 mg total) by mouth 3 (three) times daily., Disp: 60 tablet, Rfl: 2 .  cetirizine (ZYRTEC) 10 MG tablet, Take 10 mg  by mouth daily., Disp: , Rfl:  .  clorazepate (TRANXENE) 3.75 MG tablet, Take 1 tablet (3.75 mg total) by mouth 2 (two) times daily as needed for anxiety., Disp: 60 tablet, Rfl: 2 .  DULoxetine (CYMBALTA) 30 MG capsule, Take 3 capsules (90 mg total) by mouth every morning., Disp: 90 capsule, Rfl: 2 .  lamoTRIgine (LAMICTAL) 150 MG tablet, Take 1 tablet (150 mg total) by mouth at bedtime., Disp: 30 tablet, Rfl: 2   Medication Side Effects: None  Family Medical/ Social History: Changes? Yes .  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  He did travel with his daughter DElberta Fortisto a family wedding where the daughter had to help the patient drank too much at the wedding he has some alcohol on the weekends but modest according to his description though all emphasized that he must limit particularly with his medications  MENTAL HEALTH EXAM: Overweight otherwise currently 6 systems are negative. Blood pressure 134/86, pulse 76, height 6' 2.5" (1.892 m), weight 254 lb (115.2 kg).Body mass index is 32.18 kg/m.  General Appearance: Casual and Guarded  Eye Contact:  Good  Speech:  Clear and Coherent  Volume:  Normal  Mood:  Anxious and Euthymic  Affect:  Constricted, Labile and Full Range  Thought Process:  Goal Directed  Orientation:  Full (Time, Place, and Person)  Thought Content: Obsessions and Rumination   Suicidal Thoughts:  No  Homicidal Thoughts:  No  Memory:  Immediate  Judgement:  Fair  Insight:  Fair  Psychomotor Activity:  Increased and Mannerisms  Concentration:  Concentration: Good and Attention Span: Good  Recall:  Good  Fund of Knowledge: Good  Language: Good  Akathisia:  No  AIMS (if indicated): done  Assets:  Housing Resilience  ADL's:  Intact  Cognition: WNL  Prognosis:  Fair    DIAGNOSES:    ICD-10-CM   1.  Recurrent major depression in partial remission (HCC) F33.41 DULoxetine (CYMBALTA) 30 MG capsule    lamoTRIgine (LAMICTAL) 150 MG tablet  2. Generalized anxiety disorder F41.1 busPIRone (BUSPAR) 10 MG tablet    clorazepate (TRANXENE) 3.75 MG tablet    DULoxetine (CYMBALTA) 30 MG capsule  3. Avoidant personality disorder ([place]) F60.6     RECOMMENDATIONS: I have not determined diagnosis of bipolar though I respect that there are mixed features to his major depression finding helpful Lamictal currently in place of previous Saphris or Latuda.  His Cymbalta dose has been pushed above FDA dosing boundary with improved outcome and no mania, also taking the BuSpar and Tranxene.  He tolerates intense confrontation from ex-wife and myself maintaining cognition and communication well with no anger or frustration.  He offers explanations that are not completely satisfying to ex-wife but can be consolidated in and through the session by which to establish boundaries on their custody, childcare, economic investments, and crisis management in the future if needed.  Over 50% of the time spent in counseling and coordination of care attempting to reserve the patient's opportunity and needs be met while assuring safety and well-being for the family continues therapy with Lanetta Inch, [facility] all finding that very helpful.  Returns in 3 months and prescriptions for all 4 medications are sent to his pharmacy for a month supply and 3 refills BuSpar 10 mg 3 times daily, Tranxene 3.75 mg twice daily as needed, Cymbalta 90 mg every morning, and Lamictal 150 mg every bedtime.    Delight Hoh, MD

## 2018-08-17 ENCOUNTER — Ambulatory Visit: Payer: Commercial Managed Care - PPO | Admitting: Mental Health

## 2018-08-17 DIAGNOSIS — F411 Generalized anxiety disorder: Secondary | ICD-10-CM | POA: Diagnosis not present

## 2018-08-17 NOTE — Progress Notes (Signed)
      [facility] Counselor/Therapist Progress Note   Patient ID: Aaron [name], MRN: [id number]  Date: 08/17/2018  Timespent: 53 minutes                      Treatment Type: Individual  Subjective: Patient arrived on time for today's session.  Discussed progress and events since her last visit.  He shared how he has been continuing to adjust to his new job in [employer].  He stated that he has had some mild increasing anxiety due to this adjustment but stated that he has prepared financially.  He shared how he and his ex-wife continue to not agree on schedules of when they can have time with their children.  He shared how he also continues to provide support and feedback related to his daughter who continues to engage in therapy.  Work with patient from a cognitive behavioral framework throughout.  Discussed mindfulness, diaphragmatic breathing to manage stress and anxiety.   Interventions:CBT, Solution Focused and Strength-based  Mental Status Exam:   Appearance:   Casual     Behavior:  Appropriate  Motor:  WNL  Speech/Language:   Normal Rate  Affect:  anxious  Mood:  congruent  Thought process:  Logical, linear, goal directed  Thought content:    No SI/HI/AVH, coherent, relevent  Perceptual disturbances:    none  Orientation:  Full (Time, Place, and Person)  Attention:  Good  Concentration:  good  Memory:  Immediate  Fund of knowledge:   Good  Insight:    Good  Judgment:   Good  Impulse Control:  Good   Reported Symptoms: Daily anxiety, anxiety attacks, low motivation, rumination  Risk Assessment: Danger to Self:  No Self-injurious Behavior: No Danger to Others: No Duty to Warn:no Physical Aggression / Violence:No  Access to Firearms a concern: No  Gang Involvement:No   Diagnosis:   ICD-10-CM   1. Generalized anxiety disorder F41.1      Plan:  1.  Patient to continue to engage in individual counseling 2-4 times a month or as needed. 2.  Patient to identify and apply  CBT, coping skills learned in session to decrease depression and anxiety symptoms. 3.  Patient to contact this office, go to the local ED or call 911 if a crisis or emergency develops between visits.  Waldron Session, [facility]

## 2018-08-31 ENCOUNTER — Ambulatory Visit: Payer: Commercial Managed Care - PPO | Admitting: Mental Health

## 2018-08-31 DIAGNOSIS — F411 Generalized anxiety disorder: Secondary | ICD-10-CM

## 2018-08-31 NOTE — Progress Notes (Signed)
      [facility] Counselor/Therapist Progress Note   Patient ID: Aaron [name], MRN: [id number]  Date: 08/31/2018  Timespent: 46 minutes                      Treatment Type: Individual  Mental Status Exam:   Appearance:   Casual     Behavior:  Appropriate  Motor:  WNL  Speech/Language:   Normal Rate  Affect:  anxious  Mood:  congruent  Thought process:  Logical, linear, goal directed  Thought content:    No SI/HI/AVH, coherent, relevent  Perceptual disturbances:    none  Orientation:  Full (Time, Place, and Person)  Attention:  Good  Concentration:  good  Memory:  Immediate  Fund of knowledge:   Good  Insight:    Good  Judgment:   Good  Impulse Control:  Good   Reported Symptoms: Daily anxiety, anxiety attacks, low motivation, rumination  Risk Assessment: Danger to Self:  No Self-injurious Behavior: No Danger to Others: No Duty to Warn:no Physical Aggression / Violence:No  Access to Firearms a concern: No  Gang Involvement:No   Patient / guardian was educated about steps to take if suicide or homicide risk level increases between visits. While future psychiatric events cannot be accurately predicted, the patient does not currently require acute inpatient psychiatric care and [facility] not [facility].  Subjective: Patient arrived on time for today's session.  Discussed progress.  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  He expressed some anxiety related to adjusting to his new job, however, also expressed how he knows it will take time with this adjustment and reminds himself of this when needed.  He stated he continues to spend time with his daughters every other weekend.  He  stated that he plans to spend Thanksgiving with his daughters and ex-wife.  He stated that they are getting along well currently referring to his ex-wife.  He stated that he and his other ex-wife continue to work through a mediator related to custody timeframes.  Interventions:CBT, Solution Focused and Strength-based  Diagnosis:   ICD-10-CM   1. Generalized anxiety disorder F41.1      Plan:  1.  Patient to continue to engage in individual counseling 2-4 times a month or as needed. 2.  Patient to identify and apply CBT, coping skills learned in session to decrease depression and anxiety symptoms. 3.  Patient to contact this office, go to the local ED or call 911 if a crisis or emergency develops between visits.  Waldron Sessionhristopher Mandisa Persinger, [facility]

## 2018-09-20 ENCOUNTER — Ambulatory Visit: Payer: Commercial Managed Care - PPO | Admitting: Mental Health

## 2018-09-20 DIAGNOSIS — F3341 Major depressive disorder, recurrent, in partial remission: Secondary | ICD-10-CM

## 2018-09-20 NOTE — Progress Notes (Signed)
      [facility] Counselor/Therapist Progress Note   Patient ID: Aaron [name], MRN: [id number]  Date: 09/21/2018  Timespent: 54 minutes                      Treatment Type: Individual  Mental Status Exam:   Appearance:   Casual     Behavior:  Appropriate  Motor:  WNL  Speech/Language:   Normal Rate  Affect:  anxious  Mood:  congruent  Thought process:  Logical, linear, goal directed  Thought content:    No SI/HI/AVH, coherent, relevent  Perceptual disturbances:    none  Orientation:  Full (Time, Place, and Person)  Attention:  Good  Concentration:  good  Memory:  Immediate  Fund of knowledge:   Good  Insight:    Good  Judgment:   Good  Impulse Control:  Good   Reported Symptoms: Daily anxiety, anxiety attacks, low motivation, rumination  Risk Assessment: Danger to Self:  No Self-injurious Behavior: No Danger to Others: No Duty to Warn:no Physical Aggression / Violence:No  Access to Firearms a concern: No  Gang Involvement:No   Patient / guardian was educated about steps to take if suicide or homicide risk level increases between visits. While future psychiatric events cannot be accurately predicted, the patient does not currently require acute inpatient psychiatric care and does not [facility]  [facility] criteria.  Subjective: Patient arrived on time for today's session.  Discussed progress.  He stated he has been more stressed lately, since Thanksgiving. He stated he and his ex wife attended mediation. He wanted to keep the custody 50/50. 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 He stated his ex wife voiced concerns if a "crisis" occurred, referring to his mental health.  He stated the initial custody order was 50/50 custody.  He stated Junious Dresseronnie, the mediator stated she had concerns regarding his health, therefore could not go forward w/ confirming they have 50/50 custody in January which  he requested.  Patient expressed thoughts and feelings related to these issues, a level of stress that he is felt.  Provided support throughout.   Interventions:CBT, Solution Focused and Strength-based  Diagnosis:   ICD-10-CM   1. Recurrent major depression in partial remission (HCC) F33.41      Plan:  1.  Patient to continue to engage in individual counseling 2-4 times a month or as needed. 2.  Patient to identify and apply CBT, coping skills learned in session to decrease depression and anxiety symptoms. 3.  Patient to contact this office, go to the local ED or call 911 if a crisis or emergency develops between visits.  Waldron Sessionhristopher Yaw Escoto, [facility]

## 2018-09-22 ENCOUNTER — Ambulatory Visit: Payer: Commercial Managed Care - PPO | Admitting: Psychiatry

## 2018-10-15 ENCOUNTER — Ambulatory Visit: Payer: Commercial Managed Care - PPO | Admitting: Mental Health

## 2018-10-15 DIAGNOSIS — F411 Generalized anxiety disorder: Secondary | ICD-10-CM | POA: Diagnosis not present

## 2018-10-15 DIAGNOSIS — F3341 Major depressive disorder, recurrent, in partial remission: Secondary | ICD-10-CM

## 2018-10-15 NOTE — Progress Notes (Signed)
      [facility] Counselor/Therapist Progress Note   Patient ID: Aaron [name], MRN: [id number]  Date: 10/15/2018  Timespent: 54 minutes                      Treatment Type: Individual  Mental Status Exam:   Appearance:   Casual     Behavior:  Appropriate  Motor:  WNL  Speech/Language:   Normal Rate  Affect:  anxious  Mood:  congruent  Thought process:  Logical, linear, goal directed  Thought content:    No SI/HI/AVH, coherent, relevent  Perceptual disturbances:    none  Orientation:  Full (Time, Place, and Person)  Attention:  Good  Concentration:  good  Memory:  Immediate  Fund of knowledge:   Good  Insight:    Good  Judgment:   Good  Impulse Control:  Good   Reported Symptoms: Daily anxiety, anxiety attacks, low motivation, rumination  Risk Assessment: Danger to Self:  No Self-injurious Behavior: No Danger to Others: No Duty to Warn:no Physical Aggression / Violence:No  Access to Firearms a concern: No  Gang Involvement:No   Patient / guardian was educated about steps to take if suicide or homicide risk level increases between visits. While future psychiatric events cannot be accurately predicted, the patient does not currently require acute inpatient psychiatric care and does not currently meet [facility] involuntary commitment criteria.  Subjective: Patient arrived on time for today's session.  Discussed progress and events since our last visit.  He shared how he continues to work his new job, some stress as he is trying to [employer].  He stated that overall, he is on track and is still highly motivated and hopeful for success.  He shared how he continues to have anxiety regarding custody, waiting to see how time will be determined with the children.  Ways to cope and care for himself during this time were explored.  Patient verbalizes intent to be highly supportive of his children and wanting to play a significant father role in their life.  He shared how  he is attempting to engage in some pleasurable activities at times, exercise.  Encouraged him to continue as a form of self-care and coping.   Interventions:CBT, Solution Focused and Strength-based  Diagnosis: No diagnosis found.   Plan:  1.  Patient to continue to engage in individual counseling 2-4 times a month or as needed. 2.  Patient to identify and apply CBT, coping skills learned in session to decrease depression and anxiety symptoms. 3.  Patient to contact this office, go to the local ED or call 911 if a crisis or emergency develops between visits.  Waldron Session, [facility]

## 2018-10-19 ENCOUNTER — Encounter: Payer: Self-pay | Admitting: Adult Health

## 2018-10-19 ENCOUNTER — Ambulatory Visit (INDEPENDENT_AMBULATORY_CARE_PROVIDER_SITE_OTHER): Payer: Self-pay | Admitting: Adult Health

## 2018-10-19 VITALS — BP 130/90 | Temp 98.0°F | Wt 259.0 lb

## 2018-10-19 DIAGNOSIS — L0291 Cutaneous abscess, unspecified: Secondary | ICD-10-CM

## 2018-10-19 DIAGNOSIS — R61 Generalized hyperhidrosis: Secondary | ICD-10-CM

## 2018-10-19 MED ORDER — DOXYCYCLINE HYCLATE 100 MG PO CAPS: 100.0000 mg | ORAL_CAPSULE | Freq: Two times a day (BID) | ORAL | 0 refills | Status: AC

## 2018-10-19 MED ORDER — ALUMINUM CHLORIDE 20 % EX SOLN: Freq: Every day | CUTANEOUS | 0 refills | Status: DC
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
## 2018-10-25 ENCOUNTER — Ambulatory Visit: Payer: Commercial Managed Care - PPO | Admitting: Mental Health

## 2018-10-25 DIAGNOSIS — F3341 Major depressive disorder, recurrent, in partial remission: Secondary | ICD-10-CM

## 2018-10-25 NOTE — Progress Notes (Signed)
      [facility] Counselor/Therapist Progress Note   Patient ID: Aaron [name], MRN: [id number]  Date: 10/25/2018  Timespent: 54 minutes                      Treatment Type: Individual  Mental Status Exam:   Appearance:   Casual     Behavior:  Appropriate  Motor:  WNL  Speech/Language:   Normal Rate  Affect:  euthymic  Mood:  congruent  Thought process:  Logical, linear, goal directed  Thought content:    No SI/HI/AVH, coherent, relevent  Perceptual disturbances:    none  Orientation:  Full (Time, Place, and Person)  Attention:  Good  Concentration:  good  Memory:  Immediate  Fund of knowledge:   Good  Insight:    Good  Judgment:   Good  Impulse Control:  Good   Reported Symptoms: Daily anxiety, anxiety attacks (decreasing), low motivation, rumination  Risk Assessment: Danger to Self:  No Self-injurious Behavior: No Danger to Others: No Duty to Warn:no Physical Aggression / Violence:No  Access to Firearms a concern: No  Gang Involvement:No   Patient / guardian was educated about steps to take if suicide or homicide risk level increases between visits. While future psychiatric events cannot be accurately predicted, the patient does not currently require acute inpatient psychiatric care and does not currently meet [facility] involuntary commitment criteria.  Subjective: Patient arrived on time for today's session in not distress.  Patient continues to navigate child custody with 1 of his ex-wife.  He shared some frustrations due to their being no clear schedule that he had requested a few months ago.  Ways to communicate effectively were explored with patient.  Patient expressed continued want desire to be part of his children's lives on a daily basis.  He continues to work in employment and remains hopeful with motivation to be successful.  Patient shared his willingness and desire to be a loving father, supportive to his children while spending quality time to maintain  support and nurturing.  Interventions:CBT, Solution Focused and Strength-based  Diagnosis:   ICD-10-CM   1. Recurrent major depression in partial remission (HCC) F33.41      Plan:  1.  Patient to continue to engage in individual counseling 2-4 times a month or as needed. 2.  Patient to identify and apply CBT, coping skills learned in session to decrease depression and anxiety symptoms. 3.  Patient to contact this office, go to the local ED or call 911 if a crisis or emergency develops between visits.  Waldron Session, [facility]

## 2018-11-03 ENCOUNTER — Encounter: Payer: Self-pay | Admitting: Psychiatry

## 2018-11-03 ENCOUNTER — Ambulatory Visit: Payer: Commercial Managed Care - PPO | Admitting: Psychiatry

## 2018-11-03 VITALS — BP 128/86 | HR 76 | Ht 74.5 in | Wt 255.0 lb

## 2018-11-03 DIAGNOSIS — F3341 Major depressive disorder, recurrent, in partial remission: Secondary | ICD-10-CM

## 2018-11-03 DIAGNOSIS — F411 Generalized anxiety disorder: Secondary | ICD-10-CM

## 2018-11-03 MED ORDER — CLORAZEPATE DIPOTASSIUM 3.75 MG PO TABS: 3.7500 mg | ORAL_TABLET | Freq: Two times a day (BID) | ORAL | 1 refills | Status: DC | PRN

## 2018-11-03 MED ORDER — BUSPIRONE HCL 10 MG PO TABS: 10.0000 mg | ORAL_TABLET | Freq: Three times a day (TID) | ORAL | 3 refills | Status: DC

## 2018-11-03 MED ORDER — LAMOTRIGINE 150 MG PO TABS: 150.0000 mg | ORAL_TABLET | Freq: Every day | ORAL | 3 refills | Status: DC

## 2018-11-03 MED ORDER — DULOXETINE HCL 30 MG PO CPEP: 90.0000 mg | ORAL_CAPSULE | Freq: Every day | ORAL | 3 refills | Status: DC

## 2018-11-03 NOTE — Progress Notes (Signed)
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  Now on Lamictal 8 months in place of Saphris increasing Cymbalta and maintaining BuSpar, patient has consolidated active dedicated interest and involvement with family having a new job allowing more flexibility of his responsibilities and application of his ingenuity.  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  He and first wife are navigating shared parenting functions when she has naturally been apprehensive that Aaron NajjarDamian could decompensate again if overwhelmed with family responsibilities, as current wife is divorcing him now.  He has no manic symptoms and no self-harm ideation or action equivalent.  He needs refills on medications using clorazepate occasionally for anxiety pleased himself with l-methylfolate 15 mg daily having his daughter on the same.  He continues therapy with Elio Forgethris Andrews.  Depression       The patient presents with depression.  This is a recurrent problem.  The current episode started more than 1 year ago.   The onset quality is sudden.   The problem occurs intermittently.  The problem has been rapidly improving since onset.  Associated symptoms include sad.  Associated symptoms include no decreased concentration, no fatigue, no hopelessness, does not have insomnia, not irritable, no decreased interest and no suicidal ideas.     The symptoms are aggravated by medication, social issues, work stress and family issues.  Past  treatments include SNRIs - Serotonin and norepinephrine reuptake inhibitors, psychotherapy and other medications.  Compliance with treatment is variable and good.  Past compliance problems include medication issues and difficulty with treatment plan.  Previous treatment provided moderate relief.  Risk factors include a change in medication usage/dosage, history of mental illness, family history, major life event, marital problems, prior psychiatric admission and stress.   Past medical history includes recent psychiatric admission, anxiety, depression and mental health disorder.     Pertinent negatives include no chronic pain, no thyroid problem, no recent illness, no bipolar disorder, no eating disorder, no obsessive-compulsive disorder, no post-traumatic stress disorder, no schizophrenia, no suicide attempts and no head trauma. Anxiety  Presents for follow-up visit. Symptoms include compulsions, excessive worry and nervous/anxious behavior. Patient reports no decreased concentration, insomnia or suicidal ideas. Symptoms occur most days. The severity of symptoms is moderate. The quality of sleep is good. Nighttime awakenings: occasional.   His past medical history is significant for depression. There is no history of suicide attempts. Compliance with medications is 76-100%.    Individual Medical History/ Review of Systems: Changes? :No   Allergies: Patient has no known allergies.  Current Medications:  Current Outpatient Medications:  .  aluminum chloride (DRYSOL) 20 % external solution, Apply topically at bedtime., Disp: 35 mL, Rfl: 0 .  busPIRone (BUSPAR) 10 MG tablet, Take 1 tablet (10 mg total) by mouth 3 (three) times daily., Disp: 90 tablet, Rfl: 3 .  cetirizine (ZYRTEC) 10 MG tablet, Take 10 mg by mouth daily., Disp: , Rfl:  .  clorazepate (TRANXENE) 3.75 MG tablet, Take 1 tablet (3.75 mg total) by mouth 2 (two) times daily as needed  for anxiety., Disp: 60 tablet, Rfl: 1 .  doxycycline  (VIBRAMYCIN) 100 MG capsule, Take 1 capsule (100 mg total) by mouth 2 (two) times daily for 20 days., Disp: 40 capsule, Rfl: 0 .  DULoxetine (CYMBALTA) 30 MG capsule, Take 3 capsules (90 mg total) by mouth daily after breakfast., Disp: 90 capsule, Rfl: 3 .  lamoTRIgine (LAMICTAL) 150 MG tablet, Take 1 tablet (150 mg total) by mouth at bedtime., Disp: 30 tablet, Rfl: 3   Medication Side Effects: none  Family Medical/ Social History: Changes? Yes divorce is now underway with current wife as patient negotiates with previous wife informally attempting to establish active equivalent parenting rather than avoiding, having no decompensations including no manic equivalents.  MENTAL HEALTH EXAM: Muscle strength 5/5, postural reflexes 0/0 and AIMS equals 0. Blood pressure 128/86, pulse 76, height 6' 2.5" (1.892 m), weight 255 lb (115.7 kg).Body mass index is 32.3 kg/m.  General Appearance: Casual, Fairly Groomed and Guarded  Eye Contact:  Good  Speech:  Clear and Coherent  Volume:  Normal  Mood:  Anxious, Dysphoric and Euthymic  Affect:  Full Range and Anxious  Thought Process:  Goal Directed and Linear  Orientation:  Full (Time, Place, and Person)  Thought Content: Obsessions and Rumination   Suicidal Thoughts:  No  Homicidal Thoughts:  No  Memory:  Immediate;   Good Remote;   Good  Judgement:  Fair  Insight:  Fair  Psychomotor Activity:  Increased  Concentration:  Concentration: Fair and Attention Span: Fair  Recall:  [employer]  Fund of Knowledge: Good  Language: Good  Assets:  Leisure Time Physical Health Talents/Skills  ADL's:  Intact  Cognition: WNL  Prognosis:  Fair    DIAGNOSES:    ICD-10-CM   1. Recurrent major depression in partial remission (HCC) F33.41 DULoxetine (CYMBALTA) 30 MG capsule    lamoTRIgine (LAMICTAL) 150 MG tablet  2. Generalized anxiety disorder F41.1 DULoxetine (CYMBALTA) 30 MG capsule    busPIRone (BUSPAR) 10 MG tablet    clorazepate (TRANXENE) 3.75 MG tablet     Receiving Psychotherapy: Yes With Elio Forgethris Andrews, LPC   RECOMMENDATIONS: Medications as currently established are effective with fewer side effects such that patient is motivated to work with these consistently to overcome avoidance without appearing manic.  Saphris is not needed and Lamictal is effective sent as 150 mg nightly #30 [facility] [place] [facility].  He continues duloxetine 30 mg taking 3 capsules total 90 mg every morning as #90 with 3 refills sent to [facility] anxiety, depression, and avoidance.  He continues BuSpar 10 mg 3 times daily #90 with 3 refills for anxiety along with clorazepate 3.75 mg twice daily as needed #60 with 1 refill for generalized anxiety.  He has L-methylfolate 15 mg daily OTC for MTHFR, to return 3 months.   Chauncey MannGlenn E Jennings, MD

## 2018-11-08 ENCOUNTER — Ambulatory Visit: Payer: Commercial Managed Care - PPO | Admitting: Mental Health

## 2018-11-08 DIAGNOSIS — F3341 Major depressive disorder, recurrent, in partial remission: Secondary | ICD-10-CM

## 2018-11-08 DIAGNOSIS — F411 Generalized anxiety disorder: Secondary | ICD-10-CM

## 2018-11-08 NOTE — Progress Notes (Signed)
      [facility] Counselor/Therapist Progress Note   Patient ID: Aaron [name], MRN: [id number]  Date: 11/08/2018  Timespent: 45 minutes                      Treatment Type: Individual  Mental Status Exam:   Appearance:   Casual     Behavior:  Appropriate  Motor:  WNL  Speech/Language:   Normal Rate  Affect:  euthymic  Mood:  congruent  Thought process:  Logical, linear, goal directed  Thought content:    No SI/HI/AVH, coherent, relevent  Perceptual disturbances:    none  Orientation:  Full (Time, Place, and Person)  Attention:  Good  Concentration:  good  Memory:  Immediate  Fund of knowledge:   Good  Insight:    Good  Judgment:   Good  Impulse Control:  Good   Reported Symptoms: Daily anxiety, anxiety attacks (decreasing), low motivation, rumination  Risk Assessment: Danger to Self:  No Self-injurious Behavior: No Danger to Others: No Duty to Warn:no Physical Aggression / Violence:No  Access to Firearms a concern: No  Gang Involvement:No   Patient / guardian was educated about steps to take if suicide or homicide risk level increases between visits. While future psychiatric events cannot be accurately predicted, the patient does not currently require acute inpatient psychiatric care and does not currently meet [facility] involuntary commitment criteria.  Subjective: Patient arrived on time for today's session.  He shared how he continues to persevere at his new job sharing recent experiences and changes.  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  He continues to be motivated and driven toward being successful in this new career as a shared some details.  He shared where he and his ex wife are in terms of the custody schedule with their children.  He stated that at this point, his understanding is that it would be 50-50 custody, where he would have them more often.  He shared some recent  communication he had with his current wife with him he continues to be separated.  He stated that he tried to communicate with her regarding time with their son, however, she continues to only allow him to see their son a few hours at a time.  He processed thoughts and feelings about how he tries to stay positive and focus on what he can control versus what he cannot.  He continues to express wanting to be a significant support to his children.  Ways to cope and care for himself were explored.  He stated he plans to begin working out more often in the mornings as most of his afternoons are committed to work appointments.  Encouraged him to follow through with self-care.  Interventions:CBT, Solution Focused and Strength-based  Diagnosis:   ICD-10-CM   1. Recurrent major depression in partial remission (HCC) F33.41   2. Generalized anxiety disorder F41.1      Plan:  1.  Patient to continue to engage in individual counseling 2-4 times a month or as needed. 2.  Patient to identify and apply CBT, coping skills learned in session to decrease depression and anxiety symptoms. 3.  Patient to contact this office, go to the local ED or call 911 if a crisis or emergency develops between visits.  Waldron Session, [facility]

## 2018-11-22 ENCOUNTER — Ambulatory Visit: Payer: Commercial Managed Care - PPO | Admitting: Mental Health

## 2018-11-22 DIAGNOSIS — F411 Generalized anxiety disorder: Secondary | ICD-10-CM

## 2018-11-22 DIAGNOSIS — F3341 Major depressive disorder, recurrent, in partial remission: Secondary | ICD-10-CM

## 2018-11-22 NOTE — Progress Notes (Signed)
      [facility] Counselor/Therapist Progress Note   Patient ID: Aaron [name], MRN: [id number]  Date: 11/22/2018  Timespent: 45 minutes                      Treatment Type: Individual  Mental Status Exam:   Appearance:   Casual     Behavior:  Appropriate  Motor:  WNL  Speech/Language:   Normal Rate  Affect:  euthymic  Mood:  congruent  Thought process:  Logical, linear, goal directed  Thought content:    No SI/HI/AVH, coherent, relevent  Perceptual disturbances:    none  Orientation:  Full (Time, Place, and Person)  Attention:  Good  Concentration:  good  Memory:  Immediate  Fund of knowledge:   Good  Insight:    Good  Judgment:   Good  Impulse Control:  Good   Reported Symptoms: Daily anxiety, anxiety attacks (decreasing), low motivation, rumination  Risk Assessment: Danger to Self:  No Self-injurious Behavior: No Danger to Others: No Duty to Warn:no Physical Aggression / Violence:No  Access to Firearms a concern: No  Gang Involvement:No   Patient / guardian was educated about steps to take if suicide or homicide risk level increases between visits. While future psychiatric events cannot be accurately predicted, the patient does not currently require acute inpatient psychiatric care and does not currently meet [facility] involuntary commitment criteria.  Subjective: Patient arrived on time for today's session.  Discussed progress and recent events.  He shared his progress at work, his new job for the last few months.  He continues to express high motivation and is thriving at his new job per his report.  He shared some relationship stress centered around custody and visitation with his son.  He shared ways he is attempted to communicate and plan with his wife when he can have his son.  He shared how he has had some for short blocks of time on the weekend and how he wants more time with him.  Some feelings were shared, support provided.  Ways to continue to communicate  his needs were discussed.  Continue to work with patient from a strength-based, cognitive behavioral framework.  Encouraged him to follow through between sessions and to contact this office between sessions if needed.  Interventions:CBT, Solution Focused and Strength-based  Diagnosis:   ICD-10-CM   1. Recurrent major depression in partial remission (HCC) F33.41   2. Generalized anxiety disorder F41.1      Plan:  1.  Patient to continue to engage in individual counseling 2-4 times a month or as needed. 2.  Patient to identify and apply CBT, coping skills learned in session to decrease depression and anxiety symptoms. 3.  Patient to contact this office, go to the local ED or call 911 if a crisis or emergency develops between visits.  Waldron Session, [facility]

## 2018-11-23 ENCOUNTER — Ambulatory Visit: Payer: Commercial Managed Care - PPO | Admitting: Adult Health

## 2018-11-23 ENCOUNTER — Encounter: Payer: Self-pay | Admitting: Adult Health

## 2018-11-23 VITALS — BP 130/100 | Temp 98.2°F | Wt 254.0 lb

## 2018-11-23 DIAGNOSIS — R058 Other specified cough: Secondary | ICD-10-CM

## 2018-11-23 DIAGNOSIS — R05 Cough: Secondary | ICD-10-CM | POA: Diagnosis not present

## 2018-11-23 MED ORDER — PREDNISONE 10 MG PO TABS: ORAL_TABLET | ORAL | 0 refills | Status: DC
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
## 2018-12-02 ENCOUNTER — Other Ambulatory Visit: Payer: Self-pay | Admitting: Psychiatry

## 2018-12-02 DIAGNOSIS — F411 Generalized anxiety disorder: Secondary | ICD-10-CM

## 2018-12-06 ENCOUNTER — Ambulatory Visit: Payer: Commercial Managed Care - PPO | Admitting: Mental Health

## 2018-12-07 ENCOUNTER — Encounter: Payer: Self-pay | Admitting: Adult Health

## 2018-12-07 ENCOUNTER — Other Ambulatory Visit: Payer: Self-pay | Admitting: Adult Health

## 2018-12-07 DIAGNOSIS — Z3009 Encounter for other general counseling and advice on contraception: Secondary | ICD-10-CM

## 2018-12-20 ENCOUNTER — Ambulatory Visit: Payer: Commercial Managed Care - PPO | Admitting: Mental Health

## 2018-12-20 DIAGNOSIS — F3341 Major depressive disorder, recurrent, in partial remission: Secondary | ICD-10-CM

## 2018-12-20 NOTE — Progress Notes (Signed)
      [facility] Counselor/Therapist Progress Note   Patient ID: Aaron [name], MRN: [id number]  Date: 12/20/2018  Timespent: 53 minutes                      Treatment Type: Individual  Mental Status Exam:   Appearance:   Casual     Behavior:  Appropriate  Motor:  WNL  Speech/Language:   Normal Rate  Affect:  euthymic  Mood:  congruent  Thought process:  Logical, linear, goal directed  Thought content:    No SI/HI/AVH, coherent, relevent  Perceptual disturbances:    none  Orientation:  Full (Time, Place, and Person)  Attention:  Good  Concentration:  good  Memory:  Immediate  Fund of knowledge:   Good  Insight:    Good  Judgment:   Good  Impulse Control:  Good   Reported Symptoms: Daily anxiety, anxiety attacks (decreasing), low motivation, rumination  Risk Assessment: Danger to Self:  No Self-injurious Behavior: No Danger to Others: No Duty to Warn:no Physical Aggression / Violence:No  Access to Firearms a concern: No  Gang Involvement:No   Patient / guardian was educated about steps to take if suicide or homicide risk level increases between visits. While future psychiatric events cannot be accurately predicted, the patient does not currently require acute inpatient psychiatric care and does not currently meet [facility] involuntary commitment criteria.  Subjective: Patient arrived on time for today's session.  Discussed progress and recent events.  He shared how he continues to work on custody with his ex-wife.  He stated that he is in the process of continuing to have his daughters every other week as opposed to every other weekend as his ex-wife is indicated.  He also continues to try and get more time with his son from his current wife with whom he remains separated.  Allowed him time to process thoughts and feelings related to these ongoing issues.  He stated that work has been somewhat more stressful as his company was bought out by another.  He stated that he  has joined a new company and shared some recent experiences in the transition.  Ways to continue to cope and care for himself were expressed.  He plans on improving his diet and reintegrating exercise in his daily schedule.  Continue to work with patient from a cognitive behavioral framework.  Reviewed diaphragmatic breathing, mindfulness exercises to employ between sessions.  Encouraged him to follow through between sessions and to contact this office between sessions if needed.  Interventions:CBT, Solution Focused and Strength-based  Diagnosis:   ICD-10-CM   1. Recurrent major depression in partial remission (HCC) F33.41      Plan:  1.  Patient to continue to engage in individual counseling 2-4 times a month or as needed. 2.  Patient to identify and apply CBT, coping skills learned in session to decrease depression and anxiety symptoms. 3.  Patient to contact this office, go to the local ED or call 911 if a crisis or emergency develops between visits.  Waldron Session, [facility]

## 2019-01-03 ENCOUNTER — Ambulatory Visit: Payer: Commercial Managed Care - PPO | Admitting: Mental Health

## 2019-01-17 ENCOUNTER — Ambulatory Visit (INDEPENDENT_AMBULATORY_CARE_PROVIDER_SITE_OTHER): Payer: Commercial Managed Care - PPO | Admitting: Mental Health

## 2019-01-17 ENCOUNTER — Other Ambulatory Visit: Payer: Self-pay

## 2019-01-17 DIAGNOSIS — F411 Generalized anxiety disorder: Secondary | ICD-10-CM

## 2019-01-17 DIAGNOSIS — F3341 Major depressive disorder, recurrent, in partial remission: Secondary | ICD-10-CM | POA: Diagnosis not present

## 2019-01-17 NOTE — Progress Notes (Signed)
      [facility] Counselor/Therapist Progress Note   Patient ID: Aaron [name], MRN: [id number]  Date: 01/17/2019  Timespent: 53 minutes                      Treatment Type: Individual  Mental Status Exam:   Appearance:   Casual     Behavior:  Appropriate  Motor:  WNL  Speech/Language:   Normal Rate  Affect:  euthymic  Mood:  congruent  Thought process:  Logical, linear, goal directed  Thought content:    No SI/HI/AVH, coherent, relevent  Perceptual disturbances:    none  Orientation:  Full (Time, Place, and Person)  Attention:  Good  Concentration:  good  Memory:  Immediate  Fund of knowledge:   Good  Insight:    Good  Judgment:   Good  Impulse Control:  Good   Reported Symptoms: Daily anxiety, anxiety attacks (decreasing), low motivation, rumination  Risk Assessment: Danger to Self:  No Self-injurious Behavior: No Danger to Others: No Duty to Warn:no Physical Aggression / Violence:No  Access to Firearms a concern: No  Gang Involvement:No   Patient / guardian was educated about steps to take if suicide or homicide risk level increases between visits. While future psychiatric events cannot be accurately predicted, the patient does not currently require acute inpatient psychiatric care and does not currently meet [facility] involuntary commitment criteria.  Subjective: Patient arrived on time for today's session.  Discussed progress.  He shared recent changes, most noticeably from the recent viral outbreak and subsequent quarantine.  He stated he has been able to see his 2 daughters every few days.  Does not need to see his 41-year-old son due to concerns and their concerns about spreading the virus potentially.he shared other stressors, namely work-related.  He stated that the recent changes in the community of affected his productivity at work.  Ways to cope and care for himself daily were explored.  He was encouraged to follow through on scheduling his day as he stated  he has become less effective.   Continue to work with patient from a cognitive behavioral framework.  Reviewed diaphragmatic breathing, mindfulness exercises to employ between sessions.  Encouraged him to follow through between sessions and to contact this office between sessions if needed.  Interventions:CBT, Solution Focused and Strength-based  Diagnosis:   ICD-10-CM   1. Recurrent major depression in partial remission (HCC) F33.41   2. Generalized anxiety disorder F41.1      Plan:  1.  Patient to continue to engage in individual counseling 2-4 times a month or as needed. 2.  Patient to identify and apply CBT, coping skills learned in session to decrease depression and anxiety symptoms. 3.  Patient to contact this office, go to the local ED or call 911 if a crisis or emergency develops between visits.  Waldron Session, [facility]

## 2019-01-31 ENCOUNTER — Encounter: Payer: Self-pay | Admitting: Psychiatry

## 2019-01-31 ENCOUNTER — Other Ambulatory Visit: Payer: Self-pay

## 2019-01-31 ENCOUNTER — Ambulatory Visit (INDEPENDENT_AMBULATORY_CARE_PROVIDER_SITE_OTHER): Payer: Commercial Managed Care - PPO | Admitting: Mental Health

## 2019-01-31 ENCOUNTER — Ambulatory Visit (INDEPENDENT_AMBULATORY_CARE_PROVIDER_SITE_OTHER): Payer: Commercial Managed Care - PPO | Admitting: Psychiatry

## 2019-01-31 DIAGNOSIS — F3341 Major depressive disorder, recurrent, in partial remission: Secondary | ICD-10-CM | POA: Diagnosis not present

## 2019-01-31 DIAGNOSIS — F606 Avoidant personality disorder: Secondary | ICD-10-CM | POA: Diagnosis not present

## 2019-01-31 DIAGNOSIS — F411 Generalized anxiety disorder: Secondary | ICD-10-CM | POA: Diagnosis not present

## 2019-01-31 MED ORDER — LAMOTRIGINE 150 MG PO TABS: 150.0000 mg | ORAL_TABLET | Freq: Every day | ORAL | 2 refills | Status: DC

## 2019-01-31 MED ORDER — BUSPIRONE HCL 10 MG PO TABS: 10.0000 mg | ORAL_TABLET | Freq: Three times a day (TID) | ORAL | 2 refills | Status: DC

## 2019-01-31 MED ORDER — CLORAZEPATE DIPOTASSIUM 3.75 MG PO TABS: 3.7500 mg | ORAL_TABLET | Freq: Two times a day (BID) | ORAL | 2 refills | Status: DC | PRN

## 2019-01-31 MED ORDER — DULOXETINE HCL 30 MG PO CPEP: 90.0000 mg | ORAL_CAPSULE | Freq: Every day | ORAL | 2 refills | Status: DC

## 2019-01-31 NOTE — Progress Notes (Signed)
[facility] Counselor/Therapist Progress Note   Patient ID: Aaron [name], MRN: [id number]  Date: 01/31/2019  Timespent: 55 minutes                      Treatment Type: Individual   Virtual Visit via Telephone Note Connected with patient by a video enabled telemedicine/telehealth application or telephone, with their informed consent, and verified patient privacy and that I am speaking with the correct person using two identifiers. I discussed the limitations, risks, security and privacy concerns of performing psychotherapy and management service by telephone and the availability of in person appointments. I also discussed with the patient that there may be a patient responsible charge related to this service. The patient expressed understanding and agreed to proceed. I discussed the treatment planning with the patient. The patient was provided an opportunity to ask questions and all were answered. The patient agreed with the plan and demonstrated an understanding of the instructions. The patient was advised to call  our office if  symptoms worsen or feel they are in a crisis state and need immediate contact.  Therapist Location: home Patient Location: home  Mental Status Exam:   Appearance:   Casual     Behavior:  Appropriate  Motor:  WNL  Speech/Language:   Normal Rate  Affect:  euthymic  Mood:  congruent  Thought process:  Logical, linear, goal directed  Thought content:    No SI/HI/AVH, coherent, relevent  Perceptual disturbances:    none  Orientation:  Full (Time, Place, and Person)  Attention:  Good  Concentration:  good  Memory:  Immediate  Fund of knowledge:   Good  Insight:    Good  Judgment:   Good  Impulse Control:  Good   Reported Symptoms: Daily anxiety, anxiety attacks (decreasing), low motivation, rumination  Risk Assessment: Danger to Self:  No Self-injurious Behavior: No Danger to Others: No Duty to Warn:no Physical Aggression / Violence:No  Access to  Firearms a concern: No  Gang Involvement:No   Patient / guardian was educated about steps to take if suicide or homicide risk level increases between visits. While future psychiatric events cannot be accurately predicted, the patient does not currently require acute inpatient psychiatric care and does not [facility] criteria.  Subjective: Patient engaged teletherapy session.  He shared challenges and continued adjustments related to the viral pandemic.  He stated that he is financially stable enough at this point to continue to maintain his current position at work, although he has been unable to engage in sales calls.  Explored family relationships with his children.  Continues to not see his son however, is able to engage with him and video chat daily.  He continues to have some time with his daughters and shared how he prepared [place] for his ex-wife and her current husband.  He shared some other family related stressors related to his relationship specifically with his father, time was spent to allow him to process some issues and feelings related.  Continued to work with patient from a cognitive behavioral framework.  Reviewed diaphragmatic breathing, mindfulness exercises to employ between sessions.  Encouraged him to follow through between sessions and to contact this office between sessions if needed.  Interventions:CBT, Solution Focused and Strength-based  Diagnosis:   ICD-10-CM   1. Recurrent major depression in partial remission (HCC) F33.41      Plan:  1.  Patient to  continue to engage in individual counseling 2-4 times a month or as needed. 2.  Patient to identify and apply CBT, coping skills learned in session to decrease depression and anxiety symptoms. 3.  Patient to contact this office, go to the local ED or call 911 if a crisis or emergency develops between visits.  Waldron Session, [facility]

## 2019-01-31 NOTE — Progress Notes (Signed)
[facility] Check  Patient ID: Aaron GrieveDamian [name],  MRN: [id number]  PCP: Shirline FreesNafziger, Cory, NP  Date of Evaluation: 01/31/2019 Time spent:10 minutes from 0925 to 0945  I connected with patient by a video enabled telemedicine application or telephone, with their informed consent, and verified patient privacy and that I am speaking with the correct person using two identifiers.  [facility] was [facility] at [facility] and patient visually at his apartment residence.   Chief Complaint:  Chief Complaint    Depression      HISTORY/CURRENT STATUS: Aaron NajjarDamian is provided telemedicine audiovisual appointment session with consent not collateral for psychiatric interview and exam in 8034-month evaluation and management of major depression, generalized anxiety, and avoidant personality.  Patient is making progress in therapy and considers his medications adequate for supporting and sustaining such.  He has consolidated and downsized his living and work arrangements also involving the children so that all have more opportunity for satisfaction and less stress.  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  He takes Tranxene approximately once daily.  He has no mania, suicidality, psychosis, or substance use.  Depression         This is a recurrent problem.  The current episode started more than 1 month ago.   The onset quality is sudden.   The problem occurs intermittently.  The problem has been gradually improving since onset.  Associated symptoms include decreased interest and sad.  Associated symptoms include no decreased concentration, no fatigue, no helplessness, no hopelessness, does not have insomnia, not irritable, no appetite change, no headaches and no suicidal ideas.     The symptoms are aggravated by work stress, social issues and family issues.  Past treatments include SSRIs - Selective serotonin reuptake inhibitors,  SNRIs - Serotonin and norepinephrine reuptake inhibitors, psychotherapy and other medications.  Compliance with treatment is good.  Past compliance problems include medication issues and difficulty with treatment plan.  Previous treatment provided moderate relief.   Individual Medical History/ Review of Systems: Changes? :No   Allergies: Patient has no known allergies.  Current Medications:  Current Outpatient Medications:  .  aluminum chloride (DRYSOL) 20 % external solution, Apply topically at bedtime., Disp: 35 mL, Rfl: 0 .  benzonatate (TESSALON) 200 MG capsule, Take by mouth., Disp: , Rfl:  .  busPIRone (BUSPAR) 10 MG tablet, Take 1 tablet (10 mg total) by mouth 3 (three) times daily., Disp: 90 tablet, Rfl: 2 .  cetirizine (ZYRTEC) 10 MG tablet, Take 10 mg by mouth daily., Disp: , Rfl:  .  clorazepate (TRANXENE) 3.75 MG tablet, Take 1 tablet (3.75 mg total) by mouth 2 (two) times daily as needed for anxiety., Disp: 60 tablet, Rfl: 2 .  DULoxetine (CYMBALTA) 30 MG capsule, Take 3 capsules (90 mg total) by mouth daily after breakfast., Disp: 90 capsule, Rfl: 2 .  lamoTRIgine (LAMICTAL) 150 MG tablet, Take 1 tablet (150 mg total) by mouth at bedtime., Disp: 30 tablet, Rfl: 2 .  oseltamivir (TAMIFLU) 75 MG capsule, Take by mouth., Disp: , Rfl:  .  predniSONE (DELTASONE) 10 MG tablet, 40 mg x 3 days, 20 mg x 3 days, 10 mg x 3 days, Disp: 21 tablet, Rfl: 0 .  promethazine-dextromethorphan (PROMETHAZINE-DM) 6.25-15 MG/5ML syrup, TK 5 ML PO QID FOR UP TO 7 DAYS PRN, Disp: , Rfl:   Medication Side Effects: none  Family Medical/ Social History: Changes? 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  MENTAL HEALTH EXAM:  There were no vitals taken for this visit.There is no height or weight on file to calculate BMI.  as not present here today  General Appearance: N/A  Eye Contact:  N/A  Speech:  Clear and Coherent, Normal Rate and Talkative   Volume:  Normal  Mood:  Anxious, Dysphoric and Euthymic  Affect:  Appropriate, Full Range and Anxious  Thought Process:  Goal Directed, Irrelevant and Linear  Orientation:  Full (Time, Place, and Person)  Thought Content: Logical and Obsessions   Suicidal Thoughts:  No  Homicidal Thoughts:  No  Memory:  Immediate;   Good Remote;   Good  Judgement:  Fair  Insight:  Fair  Psychomotor Activity:  Normal, Increased and Mannerisms  Concentration:  Concentration: Fair and Attention Span: Good  Recall:  Good  Fund of Knowledge: Good  Language: Good  Assets:  Leisure Time Resilience Talents/Skills  ADL's:  Intact  Cognition: WNL  Prognosis:  Fair    DIAGNOSES:    ICD-10-CM   1. Recurrent major depression in partial remission (HCC) F33.41 lamoTRIgine (LAMICTAL) 150 MG tablet    DULoxetine (CYMBALTA) 30 MG capsule  2. Generalized anxiety disorder F41.1 busPIRone (BUSPAR) 10 MG tablet    DULoxetine (CYMBALTA) 30 MG capsule    clorazepate (TRANXENE) 3.75 MG tablet  3. Avoidant personality disorder (HCC) F60.6     Receiving Psychotherapy: Yes Elio Forget, [facility]   RECOMMENDATIONS: Therapy progress may be most important now as evident in his RTC and IOP therapeutics in the past which were most profitable.  He has little otherwise to say today except needing refills sent to [facility] on [address] and [address] for BuSpar 10 mg 3 times daily anxiety, Cymbalta 30 mg taking 3 every morning for anxiety and depression, Lamictal 150 mg nightly for  depression, and Tranxene 3.75 mg up to twice daily as needed for anxiety each sent as a month supply and 2 refills, to return in 3 months.  Virtual Visit via Video Note  I connected with Aaron [name] on 01/31/19 at  9:20 AM EDT by a video enabled telemedicine application and verified that I am speaking with the correct person using two identifiers.   I discussed the limitations of evaluation and management by telemedicine and the availability of  in person appointments. The patient expressed understanding and agreed to proceed.  History of Present Illness: 26-month evaluation and management of major depression, generalized anxiety, and avoidant personality notes progress in therapy and considers his medications adequate for supporting and sustaining such.  He has consolidated and downsized his living and work arrangements also involving the children so that all have more opportunity for satisfaction and less stress.  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  He takes Tranxene approximately once daily.    Observations/Objective: Mood:  Anxious, Dysphoric and Euthymic  Affect:  Appropriate, Full Range and Anxious    Assessment and Plan:  He has little otherwise to say today except needing refills sent to [facility] on [address] for BuSpar 10 mg 3 times daily anxiety, Cymbalta 30 mg taking 3 every morning for anxiety and depression, Lamictal 150 mg nightly for  depression, and Tranxene 3.75 mg up to twice daily as needed for anxiety each sent as a month supply and 2 refills  Follow Up Instructions: Return  in 3 months.   I discussed the assessment and treatment plan with the patient. The patient was provided an opportunity to ask questions and all were answered. The patient agreed with the plan and demonstrated an understanding of the instructions.   The patient was advised to call back or seek an in-person evaluation if the symptoms worsen or if the condition fails to improve as anticipated.  I provided 10 minutes of non-face-to-face time during this encounter. [place] WebEx meeting #[id number] Password: Berdie Ogren, MD  Chauncey Mann, MD

## 2019-02-14 ENCOUNTER — Other Ambulatory Visit: Payer: Self-pay

## 2019-02-14 ENCOUNTER — Ambulatory Visit: Payer: Commercial Managed Care - PPO | Admitting: Mental Health

## 2019-02-28 ENCOUNTER — Ambulatory Visit (INDEPENDENT_AMBULATORY_CARE_PROVIDER_SITE_OTHER): Payer: Commercial Managed Care - PPO | Admitting: Mental Health

## 2019-02-28 ENCOUNTER — Other Ambulatory Visit: Payer: Self-pay

## 2019-02-28 DIAGNOSIS — F3341 Major depressive disorder, recurrent, in partial remission: Secondary | ICD-10-CM | POA: Diagnosis not present

## 2019-02-28 NOTE — Progress Notes (Signed)
      [facility] Counselor/Therapist Progress Note   Patient ID: Aaron [name], MRN: [id number]  Date: 02/28/2019  Timespent: 55 minutes                      Treatment Type: Individual   Virtual Visit via Telephone Note Connected with patient by a video enabled telemedicine/telehealth application or telephone, with their informed consent, and verified patient privacy and that I am speaking with the correct person using two identifiers. I discussed the limitations, risks, security and privacy concerns of performing psychotherapy and management service by telephone and the availability of in person appointments. I also discussed with the patient that there may be a patient responsible charge related to this service. The patient expressed understanding and agreed to proceed. I discussed the treatment planning with the patient. The patient was provided an opportunity to ask questions and all were answered. The patient agreed with the plan and demonstrated an understanding of the instructions. The patient was advised to call  our office if  symptoms worsen or feel they are in a crisis state and need immediate contact.  Therapist Location: home Patient Location: home  Mental Status Exam:   Appearance:   Casual     Behavior:  Appropriate  Motor:  WNL  Speech/Language:   Normal Rate  Affect:  euthymic  Mood:  congruent  Thought process:  Logical, linear, goal directed  Thought content:    No SI/HI/AVH, coherent, relevent  Perceptual disturbances:    none  Orientation:  Full (Time, Place, and Person)  Attention:  Good  Concentration:  good  Memory:  Immediate  Fund of knowledge:   Good  Insight:    Good  Judgment:   Good  Impulse Control:  Good   Reported Symptoms: Daily anxiety, anxiety attacks (decreasing), low motivation, rumination  Risk Assessment: Danger to Self:  No Self-injurious Behavior: No Danger to Others: No Duty to Warn:no Physical Aggression / Violence:No  Access to  Firearms a concern: No  Gang Involvement:No   Patient / guardian was educated about steps to take if suicide or homicide risk level increases between visits. While future psychiatric events cannot be accurately predicted, the patient does not currently require acute inpatient psychiatric care and does not currently meet Aaron [name] involuntary commitment criteria.  Subjective: Patient engaged teletherapy [name].  He shared how his anxiety has been increased due to work stress. Has been making some sales calls, slowly increasing. His sleep has been less, rumination. Reports some indecisiveness.  Continues to see his son however, is able to engage with him and video chat daily.   He wants to improve his motivation, set appts for work to have more clients. Plans to get out more. Schedule self care activities.  Discussed daily scheduling to increase consistency.  Encouraged him to follow through between sessions and to contact this office between sessions if needed.  Interventions:CBT, Solution Focused and Strength-based  Diagnosis:   ICD-10-CM   1. Recurrent major depression in partial remission (HCC) F33.41    Plan:  1.  Patient to continue to engage in individual counseling 2-4 times a month or as needed. 2.  Patient to identify and apply CBT, coping skills learned in [name] to decrease depression and anxiety symptoms. 3.  Patient to contact this office, go to the local ED or call 911 if a crisis or emergency develops between visits.  Aaron [name], Aaron [name]

## 2019-03-15 ENCOUNTER — Ambulatory Visit (INDEPENDENT_AMBULATORY_CARE_PROVIDER_SITE_OTHER): Payer: Commercial Managed Care - PPO | Admitting: Mental Health

## 2019-03-15 ENCOUNTER — Other Ambulatory Visit: Payer: Self-pay

## 2019-03-15 DIAGNOSIS — F411 Generalized anxiety disorder: Secondary | ICD-10-CM | POA: Diagnosis not present

## 2019-03-15 NOTE — Progress Notes (Signed)
[facility] Counselor/Therapist Progress Note   Patient ID: Aaron [name], MRN: [id number]  Date: 03/15/2019  Timespent: 54 minutes                      Treatment Type: Individual   Virtual Visit via Telephone Note Connected with patient by a video enabled telemedicine/telehealth application or telephone, with their informed consent, and verified patient privacy and that I am speaking with the correct person using two identifiers. I discussed the limitations, risks, security and privacy concerns of performing psychotherapy and management service by telephone and the availability of in person appointments. I also discussed with the patient that there may be a patient responsible charge related to this service. The patient expressed understanding and agreed to proceed. I discussed the treatment planning with the patient. The patient was provided an opportunity to ask questions and all were answered. The patient agreed with the plan and demonstrated an understanding of the instructions. The patient was advised to call  our office if  symptoms worsen or feel they are in a crisis state and need immediate contact.  Therapist Location: home Patient Location: home  Mental Status Exam:   Appearance:   Casual     Behavior:  Appropriate  Motor:  WNL  Speech/Language:   Normal Rate  Affect:  Full range  Mood:  Congruent, anxious  Thought process:  Logical, linear, goal directed  Thought content:    No SI/HI/AVH, coherent, relevent  Perceptual disturbances:    none  Orientation:  Full (Time, Place, and Person)  Attention:  Good  Concentration:  good  Memory:  Immediate  Fund of knowledge:   Good  Insight:    Good  Judgment:   Good  Impulse Control:  Good   Reported Symptoms: Daily anxiety, anxiety attacks (decreasing), low motivation, rumination  Risk Assessment: Danger to Self:  No Self-injurious Behavior: No Danger to Others: No Duty to Warn:no Physical Aggression / Violence:No   Access to Firearms a concern: No  Gang Involvement:No   Patient / guardian was educated about steps to take if suicide or homicide risk level increases between visits. While future psychiatric events cannot be accurately predicted, the patient does not currently require acute inpatient psychiatric care and does not currently meet [facility] involuntary commitment criteria.  Subjective: Patient engaged teletherapy session.  He shared how he has had some work stress.  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 Has able to see her daughters lately. Has been able to see his son about 2 x over the last 2 weeks. He and his wife have mediation tomorrow. He is focused on wanting to be a part of his son's life and is hopeful they can come to a mutual agreement for their son. Some sleep problems, rumination at night at times. Reports some indecisiveness.   reviewed daily scheduling to increase consistency and using diaphragmatic breathing exercises daily.  Encouraged him to follow through between sessions and to contact this office between sessions if needed.  Interventions:CBT, Solution Focused and Strength-based  Diagnosis:   ICD-10-CM   1. Generalized anxiety disorder F41.1    Plan:  1.  Patient to continue to engage in individual counseling 2-4 times a month or as needed. 2.  Patient to identify and apply CBT, coping skills learned in session to decrease depression and anxiety symptoms. 3.  Patient to contact this office, go to the local ED or call 911 if a crisis  or emergency develops between visits.  Waldron Sessionhristopher Jemila Camille, [facility]

## 2019-03-29 ENCOUNTER — Other Ambulatory Visit: Payer: Self-pay

## 2019-03-29 ENCOUNTER — Ambulatory Visit (INDEPENDENT_AMBULATORY_CARE_PROVIDER_SITE_OTHER): Payer: Commercial Managed Care - PPO | Admitting: Mental Health

## 2019-03-29 DIAGNOSIS — F411 Generalized anxiety disorder: Secondary | ICD-10-CM | POA: Diagnosis not present

## 2019-03-29 DIAGNOSIS — F330 Major depressive disorder, recurrent, mild: Secondary | ICD-10-CM

## 2019-03-29 NOTE — Progress Notes (Signed)
[facility] Counselor/Therapist Progress Note   Patient ID: Aaron GrieveDamian Speros, MRN: [id number]  Date: 03/29/2019  Timespent: 54 minutes                      Treatment Type: Individual   Virtual Visit via Telephone Note Connected with patient by a video enabled telemedicine/telehealth application or telephone, with their informed consent, and verified patient privacy and that I am speaking with the correct person using two identifiers. I discussed the limitations, risks, security and privacy concerns of performing psychotherapy and management service by telephone and the availability of in person appointments. I also discussed with the patient that there may be a patient responsible charge related to this service. The patient expressed understanding and agreed to proceed. I discussed the treatment planning with the patient. The patient was provided an opportunity to ask questions and all were answered. The patient agreed with the plan and demonstrated an understanding of the instructions. The patient was advised to call  our office if  symptoms worsen or feel they are in a crisis state and need immediate contact.  Therapist Location: home Patient Location: home  Mental Status Exam:   Appearance:   Casual     Behavior:  Appropriate  Motor:  WNL  Speech/Language:   Normal Rate  Affect:  Full range  Mood:  Congruent, anxious  Thought process:  Logical, linear, goal directed  Thought content:    No SI/HI/AVH, coherent, relevent  Perceptual disturbances:    none  Orientation:  Full (Time, Place, and Person)  Attention:  Good  Concentration:  good  Memory:  Immediate  Fund of knowledge:   Good  Insight:    Good  Judgment:   Good  Impulse Control:  Good   Reported Symptoms: Daily anxiety, anxiety attacks (decreasing), low motivation, rumination  Risk Assessment: Danger to Self:  No Self-injurious Behavior: No Danger to Others: No Duty to Warn:no Physical Aggression / Violence:No   Access to Firearms a concern: No  Gang Involvement:No   Patient / guardian was educated about steps to take if suicide or homicide risk level increases between visits. While future psychiatric events cannot be accurately predicted, the patient does not currently require acute inpatient psychiatric care and does not [facility] criteria.  Subjective: Patient engaged teletherapy session.  Discussed progress. 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 Continues to see his daughter on weekends. Went to mediation w/ his wife re; their son. He stated nothing changed in terms of visitation.  Has been able to see his son about 2 x over the last 2 weeks. He and his wife have mediation tomorrow. He is focused on wanting to be a part of his son's life and is hopeful they can come to a mutual agreement for their son. Some sleep problems, rumination at night at times. Reports some indecisiveness.   reviewed daily scheduling to increase consistency and using diaphragmatic breathing exercises daily.  Encouraged him to follow through between sessions and to contact this office between sessions if needed.  Interventions:CBT, Solution Focused and Strength-based  Diagnosis: No diagnosis found.   Plan:  1.  Patient to continue to engage in individual counseling 2-4 times a month or as needed. 2.  Patient to identify and apply CBT, coping skills learned in session to decrease depression and anxiety symptoms. 3.  Patient to contact this office, go to  the local ED or call 911 if a crisis or emergency develops between visits.  Anson [place], [facility]

## 2019-04-12 ENCOUNTER — Ambulatory Visit: Payer: Commercial Managed Care - PPO | Admitting: Mental Health

## 2019-04-12 ENCOUNTER — Other Ambulatory Visit: Payer: Self-pay

## 2019-04-12 DIAGNOSIS — F411 Generalized anxiety disorder: Secondary | ICD-10-CM

## 2019-04-12 NOTE — Progress Notes (Signed)
      [facility] Counselor/Therapist Progress Note   Patient ID: Aaron [name], MRN: [id number]  Date: 04/12/2019  Timespent: 55 minutes                      Treatment Type: Individual   Mental Status Exam:   Appearance:   Casual     Behavior:  Appropriate  Motor:  WNL  Speech/Language:   Normal Rate  Affect:  Full range  Mood:  Congruent, anxious  Thought process:  Logical, linear, goal directed  Thought content:    No SI/HI/AVH, coherent, relevent  Perceptual disturbances:    none  Orientation:  Full (Time, Place, and Person)  Attention:  Good  Concentration:  good  Memory:  Immediate  Fund of knowledge:   Good  Insight:    Good  Judgment:   Good  Impulse Control:  Good   Reported Symptoms: Daily anxiety, anxiety attacks (decreasing), low motivation, rumination  Risk Assessment: Danger to Self:  No Self-injurious Behavior: No Danger to Others: No Duty to Warn:no Physical Aggression / Violence:No  Access to Firearms a concern: No  Gang Involvement:No   Patient / guardian was educated about steps to take if suicide or homicide risk level increases between visits. While future psychiatric events cannot be accurately predicted, the patient does not currently require acute inpatient psychiatric care and does not currently meet [facility] involuntary commitment criteria.  Subjective: Patient engaged in today's session in no distress.  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  He discussed some specific ways he was trying to increase his numbers while the company as a whole has struggled collectively with their sales due to the viral pandemic.  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  Continues to await to hear from his wife with him he remains separated for the past year  regarding their custody arrangements.  He stated that through mediation approximately 1 month ago, there were no concrete plans in place and he nor his attorney has heard from his wife or her attorney regarding any outcomes.  He plans to visit his parents next week with his daughters.  He looks forward to this time with his daughters.  Shared some relationship concerns related to his parents, specifically his father.  Identified needing more validation and support from his mother.  Provided support, understanding discussing some communication strategies as well as ways he is effectively set boundaries in these relationships.  Interventions:CBT, Solution Focused and Strength-based  Diagnosis:   ICD-10-CM   1. Generalized anxiety disorder  F41.1      Plan:  1.  Patient to continue to engage in individual counseling 2-4 times a month or as needed. 2.  Patient to identify and apply CBT, coping skills learned in session to decrease depression and anxiety symptoms. 3.  Patient to contact this office, go to the local ED or call 911 if a crisis or emergency develops between visits.  Anson [place], [facility]

## 2019-04-26 ENCOUNTER — Ambulatory Visit: Payer: Commercial Managed Care - PPO | Admitting: Mental Health

## 2019-04-26 ENCOUNTER — Other Ambulatory Visit: Payer: Self-pay

## 2019-04-26 DIAGNOSIS — F411 Generalized anxiety disorder: Secondary | ICD-10-CM

## 2019-04-26 NOTE — Progress Notes (Signed)
      [facility] Counselor/Therapist Progress Note   Patient ID: Aaron [name], MRN: [id number]  Date: 04/26/2019  Timespent: 51 minutes                      Treatment Type: Individual   Mental Status Exam:   Appearance:   Casual     Behavior:  Appropriate  Motor:  WNL  Speech/Language:   Normal Rate  Affect:  Full range  Mood:  Congruent, anxious  Thought process:  Logical, linear, goal directed  Thought content:    No SI/HI/AVH, coherent, relevent  Perceptual disturbances:    none  Orientation:  Full (Time, Place, and Person)  Attention:  Good  Concentration:  good  Memory:  Immediate  Fund of knowledge:   Good  Insight:    Good  Judgment:   Good  Impulse Control:  Good   Reported Symptoms: Daily anxiety, anxiety attacks (decreasing), low motivation, rumination  Risk Assessment: Danger to Self:  No Self-injurious Behavior: No Danger to Others: No Duty to Warn:no Physical Aggression / Violence:No  Access to Firearms a concern: No  Gang Involvement:No   Patient / guardian was educated about steps to take if suicide or homicide risk level increases between visits. While future psychiatric events cannot be accurately predicted, the patient does not currently require acute inpatient psychiatric care and does not currently meet [facility] involuntary commitment criteria.  Subjective: Patient engaged in today's session in no distress.  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  Shared how he and his daughters were able to visit his parents out of state and shared how the visit was pleasant overall.  He stated that although he has some strain in the relationship with his father, there were no problems during the visit.  He continues to have limited contact with his son, engaging in video calls or seeing him from several feet of  distance due to the viral pandemic.  Patient shared his feelings related to how his wife with him he remains separated, has allowed their son to see some extended family which contradicts her boundaries set with patient and saying their son.  He is still hopeful that he will have clear custody defined in the coming weeks or months as they continue to mediate.  Ways to continue to cope himself were explored and some reviewed.  Provided support, understanding discussing some communication strategies, continuing to work from a cognitive behavioral framework.  Interventions:CBT, Solution Focused and Strength-based  Diagnosis:   ICD-10-CM   1. Generalized anxiety disorder  F41.1      Plan:  1.  Patient to continue to engage in individual counseling 2-4 times a month or as needed. 2.  Patient to identify and apply CBT, coping skills learned in session to decrease depression and anxiety symptoms. 3.  Patient to contact this office, go to the local ED or call 911 if a crisis or emergency develops between visits.  Anson [place], [facility]

## 2019-05-03 ENCOUNTER — Encounter: Payer: Self-pay | Admitting: Psychiatry

## 2019-05-03 ENCOUNTER — Ambulatory Visit (INDEPENDENT_AMBULATORY_CARE_PROVIDER_SITE_OTHER): Payer: Commercial Managed Care - PPO | Admitting: Psychiatry

## 2019-05-03 ENCOUNTER — Other Ambulatory Visit: Payer: Self-pay

## 2019-05-03 VITALS — Ht 74.5 in | Wt 256.0 lb

## 2019-05-03 DIAGNOSIS — F606 Avoidant personality disorder: Secondary | ICD-10-CM | POA: Diagnosis not present

## 2019-05-03 DIAGNOSIS — F3341 Major depressive disorder, recurrent, in partial remission: Secondary | ICD-10-CM | POA: Diagnosis not present

## 2019-05-03 DIAGNOSIS — F411 Generalized anxiety disorder: Secondary | ICD-10-CM | POA: Diagnosis not present

## 2019-05-03 MED ORDER — CLORAZEPATE DIPOTASSIUM 3.75 MG PO TABS: 3.7500 mg | ORAL_TABLET | Freq: Two times a day (BID) | ORAL | 1 refills | Status: DC | PRN

## 2019-05-03 MED ORDER — BUSPIRONE HCL 10 MG PO TABS: 10.0000 mg | ORAL_TABLET | Freq: Three times a day (TID) | ORAL | 2 refills | Status: DC

## 2019-05-03 MED ORDER — LAMOTRIGINE 150 MG PO TABS: 150.0000 mg | ORAL_TABLET | Freq: Every day | ORAL | 2 refills | Status: DC

## 2019-05-03 MED ORDER — DULOXETINE HCL 30 MG PO CPEP: 90.0000 mg | ORAL_CAPSULE | Freq: Every day | ORAL | 2 refills | Status: DC

## 2019-05-03 NOTE — Progress Notes (Signed)
[facility] Med Check  Patient ID: Aaron [name],  MRN: [id number]  PCP: Shirline FreesNafziger, Cory, NP  Date of Evaluation: 05/03/2019 Time spent:20 minutes from 0900 to 0920  Chief Complaint:  Chief Complaint    Depression; Anxiety; Paranoid      HISTORY/CURRENT STATUS: Aaron [name] is seen onsite in office face-to-face with consent individually with epic including therapy collateral for psychiatric interview and exam in 7416-month evaluation and management of major depression, generalized anxiety, and avoidant personality.  The patient for the first time in a couple of years saw parents on July 4 with his daughters stating his motivation was primarily to give the daughters that experience so he did not relate very openly with parents.  Still the experience was uneventful and overall successful except the things he worked on at [facility] [employer] were not necessarily applied and resolved in his family visit.  Similarly the legal proceedings with current separated wife particularly over their new son are not proceeding well as wife is controlling in overdetermine fashion regarding visitation, and patient attempts to cope and collaborate as possible.  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  However he is interviewing at [employer] for at least part-time work there.  He has more anxiety for these reasons but takes less Tranxene with a 30-day supply usually lasting 45 days not necessarily liking the benzodiazepines but finding them necessary at times.  The BuSpar helps but not sufficiently, but he declines to increase the dose.  He is not depressed and has no other problems being most satisfied with his l-methylfolate asking about other homeopathics and CBD oil for his symptoms not wanting more medication.  He gets along well with  his first wife Aaron [name].  He attends therapy regularly, and he reviews today Aaron [name] his current separated [facility] when they were searching for the optimal place for him, though he must wonder now if she needs to attend.  He has no mania, suicidality, psychosis, or dissociation.  Depression        This is a recurrent problem with the current episode started more than 1 month ago.   The onset quality is sudden.   The problem occurs intermittently.  The problem has been gradually improving since onset.  Associated symptoms include decreased interest and sad.  Associated symptoms include no decreased concentration, no fatigue, no helplessness, no hopelessness, does not have insomnia, not irritable, no appetite change, no headaches and no suicidal ideas.     The symptoms are aggravated by work stress, social issues and family issues.  Past treatments include SSRIs - Selective serotonin reuptake inhibitors, SNRIs - Serotonin and norepinephrine reuptake inhibitors, psychotherapy and other medications.  Compliance with treatment is good.  Past compliance problems include medication issues and difficulty with treatment plan.  Previous treatment provided moderate relief.  Individual Medical History/ Review of Systems: Changes? :No He appreciates l-methylfolate for MTHFR and expresses interest as he researches trying CBD oil or other homeopathics.  Allergies: Patient has no known allergies.  Current Medications:  Current Outpatient Medications:  .  aluminum chloride (DRYSOL) 20 % external solution, Apply topically at bedtime., Disp: 35 mL, Rfl: 0 .  benzonatate (TESSALON) 200 MG capsule, Take by mouth., Disp: , Rfl:  .  busPIRone (BUSPAR) 10 MG tablet, Take 1 tablet (10 mg total) by mouth 3 (  three) times daily., Disp: 90 tablet, Rfl: 2 .  cetirizine (ZYRTEC) 10 MG tablet, Take 10 mg by mouth daily., Disp: , Rfl:  .  clorazepate (TRANXENE) 3.75 MG tablet, Take 1 tablet (3.75 mg total) by mouth 2  (two) times daily as needed for anxiety., Disp: 60 tablet, Rfl: 1 .  DULoxetine (CYMBALTA) 30 MG capsule, Take 3 capsules (90 mg total) by mouth daily after breakfast., Disp: 90 capsule, Rfl: 2 .  lamoTRIgine (LAMICTAL) 150 MG tablet, Take 1 tablet (150 mg total) by mouth at bedtime., Disp: 30 tablet, Rfl: 2 .  oseltamivir (TAMIFLU) 75 MG capsule, Take by mouth., Disp: , Rfl:  .  predniSONE (DELTASONE) 10 MG tablet, 40 mg x 3 days, 20 mg x 3 days, 10 mg x 3 days, Disp: 21 tablet, Rfl: 0 .  promethazine-dextromethorphan (PROMETHAZINE-DM) 6.25-15 MG/5ML syrup, TK 5 ML PO QID FOR UP TO 7 DAYS PRN, Disp: , Rfl:    Medication Side Effects: none  Family Medical/ Social History: Changes? Yes patient is more secure and operational regarding family contacts but is not relating to resolve past conflicts for relief for prevention of consequence MENTAL HEALTH EXAM:  Height 6' 2.5" (1.892 m), weight 256 lb (116.1 kg).Body mass index is 32.43 kg/m.  Others deferred as nonessential in coronavirus pandemic  General Appearance: Casual, Fairly Groomed, Guarded and Obese  Eye Contact:  Good  Speech:  Clear and Coherent, Normal Rate and Talkative  Volume:  Normal  Mood:  Anxious, Dysphoric, Euthymic and Worthless  Affect:  Congruent, Constricted and Anxious  Thought Process:  Coherent, Goal Directed and Irrelevant  Orientation:  Full (Time, Place, and Person)  Thought Content: Logical, Illogical, Ilusions, Obsessions and Rumination   Suicidal Thoughts:  No  Homicidal Thoughts:  No  Memory:  Immediate;   Good Remote;   Good  Judgement:  Good  Insight:  Fair  Psychomotor Activity:  Normal, Mannerisms and Restlessness  Concentration:  Concentration: Fair and Attention Span: Good  Recall:  Good  Fund of Knowledge: Good  Language: Fair  Assets:  Desire for Improvement Leisure Time Resilience Talents/Skills  ADL's:  Intact  Cognition: WNL  Prognosis:  Fair    DIAGNOSES:    ICD-10-CM   1. Recurrent  major depression in partial remission (HCC)  F33.41 lamoTRIgine (LAMICTAL) 150 MG tablet    DULoxetine (CYMBALTA) 30 MG capsule  2. Generalized anxiety disorder  F41.1 DULoxetine (CYMBALTA) 30 MG capsule    clorazepate (TRANXENE) 3.75 MG tablet    busPIRone (BUSPAR) 10 MG tablet  3. Avoidant personality disorder ([place])  F60.6     Receiving Psychotherapy: Yes Lanetta Inch, [facility]  RECOMMENDATIONS: Psychosupportive psychoeducation updates prevention and monitoring, safety hygiene, and crisis plans if needed, though he has no crisis since March 2019 inpatient stay and subsequent PHP/IOP.  [place] registry documents last Tranxene 03/17/2019.  Though he continues l-methylfolate, his psychiatric prescriptions start with Lamictal 150 mg every bedtime sent as a month supply and 2 refills to [facility] for depression.  Cymbalta is continued 30 mg capsule as 3 capsules total 90 mg every morning sent as #90 with 2 refills to [facility] for GAD, avoidance, and depression.  BuSpar continues 10 mg 3 times daily sent as #90 with 2 refills to [facility] for generalized anxiety.  Tranxene 3.75 mg twice daily as needed is sent as #60 with 1 refill for GAD E scribed to [facility].Marland Kitchen  He returns for follow-up in 3 months.   Delight Hoh, MD

## 2019-05-10 ENCOUNTER — Ambulatory Visit (INDEPENDENT_AMBULATORY_CARE_PROVIDER_SITE_OTHER): Payer: Commercial Managed Care - PPO | Admitting: Mental Health

## 2019-05-10 ENCOUNTER — Other Ambulatory Visit: Payer: Self-pay

## 2019-05-10 DIAGNOSIS — F3341 Major depressive disorder, recurrent, in partial remission: Secondary | ICD-10-CM | POA: Diagnosis not present

## 2019-05-10 DIAGNOSIS — F411 Generalized anxiety disorder: Secondary | ICD-10-CM | POA: Diagnosis not present

## 2019-05-10 NOTE — Progress Notes (Signed)
      [facility] Counselor/Therapist Progress Note   Patient ID: Aaron [name], MRN: [id number]  Date: 05/10/2019  Timespent: 55 minutes                      Treatment Type: Individual   Mental Status Exam:   Appearance:   Casual     Behavior:  Appropriate  Motor:  WNL  Speech/Language:   Normal Rate  Affect:  Full range  Mood:  Congruent, anxious  Thought process:  Logical, linear, goal directed  Thought content:    No SI/HI/AVH, coherent, relevent  Perceptual disturbances:    none  Orientation:  Full (Time, Place, and Person)  Attention:  Good  Concentration:  good  Memory:  Immediate  Fund of knowledge:   Good  Insight:    Good  Judgment:   Good  Impulse Control:  Good   Reported Symptoms:   anxiety  Risk Assessment: Danger to Self:  No Self-injurious Behavior: No Danger to Others: No Duty to Warn:no Physical Aggression / Violence:No  Access to Firearms a concern: No  Gang Involvement:No   Patient / guardian was educated about steps to take if suicide or homicide risk level increases between visits. While future psychiatric events cannot be accurately predicted, the patient does not currently require acute inpatient psychiatric care and does not currently meet [facility] involuntary commitment criteria.  Subjective: Patient engaged in today's session in no distress.  Shared events since last visit.  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  Continues to see his 41-year-old son at times with limited contact due to the recent viral pandemic.  Denies feeling depressed, reports some subsequent anxiety at times due to continued life adjustments.  shared how he has not heard from his wife about custody but stated that there is a court date next month. ways to continue to cope himself were  explored and some reviewed.  Provided support, understanding discussing some communication strategies, continuing to work from a cognitive behavioral framework.  Interventions:CBT, Solution Focused and Strength-based  Diagnosis: No diagnosis found.   Plan:  1.  Patient to continue to engage in individual counseling 2-4 times a month or as needed. 2.  Patient to identify and apply CBT, coping skills learned in session to decrease depression and anxiety symptoms. 3.  Patient to contact this office, go to the local ED or call 911 if a crisis or emergency develops between visits.  Anson [place], [facility]

## 2019-05-24 ENCOUNTER — Other Ambulatory Visit: Payer: Self-pay

## 2019-05-24 ENCOUNTER — Ambulatory Visit (INDEPENDENT_AMBULATORY_CARE_PROVIDER_SITE_OTHER): Payer: Commercial Managed Care - PPO | Admitting: Mental Health

## 2019-05-24 DIAGNOSIS — F411 Generalized anxiety disorder: Secondary | ICD-10-CM | POA: Diagnosis not present

## 2019-05-24 DIAGNOSIS — F3341 Major depressive disorder, recurrent, in partial remission: Secondary | ICD-10-CM

## 2019-05-24 NOTE — Progress Notes (Signed)
      [facility] Counselor/Therapist Progress Note   Patient ID: Aaron [name], MRN: [id number]  Date: 05/24/2019  Timespent: 45 minutes                      Treatment Type: Individual   Mental Status Exam:   Appearance:   Casual     Behavior:  Appropriate  Motor:  WNL  Speech/Language:   Normal Rate  Affect:  Full range  Mood:  Congruent, anxious  Thought process:  Logical, linear, goal directed  Thought content:    No SI/HI/AVH, coherent, relevent  Perceptual disturbances:    none  Orientation:  Full (Time, Place, and Person)  Attention:  Good  Concentration:  good  Memory:  Immediate  Fund of knowledge:   Good  Insight:    Good  Judgment:   Good  Impulse Control:  Good   Reported Symptoms:   anxiety  Risk Assessment: Danger to Self:  No Self-injurious Behavior: No Danger to Others: No Duty to Warn:no Physical Aggression / Violence:No  Access to Firearms a concern: No  Gang Involvement:No   Patient / guardian was educated about steps to take if suicide or homicide risk level increases between visits. While future psychiatric events cannot be accurately predicted, the patient does not currently require acute inpatient psychiatric care and does not currently meet [facility] involuntary commitment criteria.  Subjective: Patient engaged in today's session in no distress.  Shared some family related stress. Parents have offered him a vehicle.  Reports having some conflicted feelings due to the relational history.  Patient is unsure at this point if he will accept the behavioral.  Patient shared how his primary relational strain is with his father.  Patient went on to provide details sharing feelings.  Provide support, understanding.  Patient was encouraged to give himself time to make this decision carefully.  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  Patient remains hopeful about his business start up.  Patient  shared experiences with his children recently.  Continues to see his son with limited contact due to the viral pandemic.  He remains hopeful about having more time with his son as he continues to navigate options in the custody determination process with his attorney.  Patient is focused on having more time with his son as he wants to fulfill his role as a loving and supportive father.  Patient has been making attempts to engage in exercise as a way to care for himself and has engaged his children recreational activities when possible.  Interventions:CBT, Solution Focused and Strength-based  Diagnosis:   ICD-10-CM   1. Recurrent major depression in partial remission (HCC)  F33.41   2. Generalized anxiety disorder  F41.1      Plan:  1.  Patient to continue to engage in individual counseling 2-4 times a month or as needed. 2.  Patient to identify and apply CBT, coping skills learned in session to decrease depression and anxiety symptoms. 3.  Patient to contact this office, go to the local ED or call 911 if a crisis or emergency develops between visits.  Anson [place], [facility]

## 2019-06-14 ENCOUNTER — Ambulatory Visit: Payer: Commercial Managed Care - PPO | Admitting: Mental Health

## 2019-06-28 ENCOUNTER — Ambulatory Visit (INDEPENDENT_AMBULATORY_CARE_PROVIDER_SITE_OTHER): Payer: Commercial Managed Care - PPO | Admitting: Mental Health

## 2019-06-28 ENCOUNTER — Other Ambulatory Visit: Payer: Self-pay

## 2019-06-28 DIAGNOSIS — F411 Generalized anxiety disorder: Secondary | ICD-10-CM | POA: Diagnosis not present

## 2019-06-28 NOTE — Progress Notes (Signed)
      [facility] Counselor/Therapist Progress Note   Patient ID: Aaron [name], MRN: [id number]  Date: 06/28/2019  Timespent: 53 minutes                      Treatment Type: Individual   Mental Status Exam:   Appearance:   Casual     Behavior:  Appropriate  Motor:  WNL  Speech/Language:   Normal Rate  Affect:  Full range  Mood:  Congruent, anxious  Thought process:  Logical, linear, goal directed  Thought content:    No SI/HI/AVH, coherent, relevent  Perceptual disturbances:    none  Orientation:  Full (Time, Place, and Person)  Attention:  Good  Concentration:  good  Memory:  Immediate  Fund of knowledge:   Good  Insight:    Good  Judgment:   Good  Impulse Control:  Good   Reported Symptoms:   anxiety  Risk Assessment: Danger to Self:  No Self-injurious Behavior: No Danger to Others: No Duty to Warn:no Physical Aggression / Violence:No  Access to Firearms a concern: No  Gang Involvement:No   Patient / guardian was educated about steps to take if suicide or homicide risk level increases between visits. While future psychiatric events cannot be accurately predicted, the patient does not currently require acute inpatient psychiatric care and does not currently meet [facility] involuntary commitment criteria.  Subjective: Patient engaged in today's session in no distress.  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  He is in the process of Education officer, museum and shared other steps he is taken to get the business started.  He continues to see his daughters and shared the continued coparenting that he and his ex-wife share.  He awaits court date next week regarding custody of his 41-year-old son.  He continues to see him on weekends observing social distancing.  He expressed some feelings of anxiety due to not being able to have physical contact with him for the past 6 months.  He is hopeful that he will be given more time with  him in the future.  We continue to work with patient from a cognitive behavioral framework, he plans to continue to identify thoughts related to his feeling more anxious, working to reframe them with more logic-based, calming self talk.  Invited him to contact this office between sessions if needed.  Interventions:CBT, Solution Focused and Strength-based  Diagnosis:   ICD-10-CM   1. Generalized anxiety disorder  F41.1      Plan:  1.  Patient to continue to engage in individual counseling 2-4 times a month or as needed. 2.  Patient to identify and apply CBT, coping skills learned in session to decrease depression and anxiety symptoms. 3.  Patient to contact this office, go to the local ED or call 911 if a crisis or emergency develops between visits.  Anson [place], [facility]

## 2019-07-19 ENCOUNTER — Ambulatory Visit: Payer: Commercial Managed Care - PPO | Admitting: Mental Health

## 2019-08-03 ENCOUNTER — Ambulatory Visit (INDEPENDENT_AMBULATORY_CARE_PROVIDER_SITE_OTHER): Payer: Commercial Managed Care - PPO | Admitting: Psychiatry

## 2019-08-03 ENCOUNTER — Other Ambulatory Visit: Payer: Self-pay

## 2019-08-03 ENCOUNTER — Encounter: Payer: Self-pay | Admitting: Psychiatry

## 2019-08-03 VITALS — Ht 74.5 in | Wt 252.0 lb

## 2019-08-03 DIAGNOSIS — F3341 Major depressive disorder, recurrent, in partial remission: Secondary | ICD-10-CM | POA: Diagnosis not present

## 2019-08-03 DIAGNOSIS — F606 Avoidant personality disorder: Secondary | ICD-10-CM | POA: Diagnosis not present

## 2019-08-03 DIAGNOSIS — F411 Generalized anxiety disorder: Secondary | ICD-10-CM | POA: Diagnosis not present

## 2019-08-03 MED ORDER — LAMOTRIGINE 100 MG PO TABS: 100.0000 mg | ORAL_TABLET | Freq: Every day | ORAL | 2 refills | Status: DC

## 2019-08-03 MED ORDER — LAMOTRIGINE 100 MG PO TABS: 150.0000 mg | ORAL_TABLET | Freq: Every day | ORAL | 2 refills | Status: DC

## 2019-08-03 MED ORDER — DULOXETINE HCL 30 MG PO CPEP: 90.0000 mg | ORAL_CAPSULE | Freq: Every day | ORAL | 2 refills | Status: DC

## 2019-08-03 MED ORDER — CLORAZEPATE DIPOTASSIUM 3.75 MG PO TABS: 3.7500 mg | ORAL_TABLET | Freq: Two times a day (BID) | ORAL | 1 refills | Status: DC | PRN

## 2019-08-03 MED ORDER — BUSPIRONE HCL 10 MG PO TABS: 10.0000 mg | ORAL_TABLET | Freq: Three times a day (TID) | ORAL | 2 refills | Status: DC

## 2019-08-03 MED ORDER — ZOLPIDEM TARTRATE 10 MG PO TABS: 10.0000 mg | ORAL_TABLET | Freq: Every day | ORAL | 2 refills | Status: DC

## 2019-08-03 NOTE — Progress Notes (Signed)
[facility]  Patient ID: Aaron [name],  MRN: [id number]  PCP: Shirline Frees, NP  Date of Evaluation: 08/03/2019 Time spent:20 minutes 0910 to 0930  Chief Complaint:  Chief Complaint    Depression; Anxiety      HISTORY/CURRENT STATUS: Aaron [name] is seen onsite in office 20 minutes face-to-face individually with consent with epic collateral for psychiatric interview and exam in 28-month evaluation and management of major depression, generalized anxiety, and avoidant personality.  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 There is no evidence of mania in his business decisions.  Starting the business is slow but he is away from the maladaptive sales techniques and communications to the public of the former employer.  He is also associating with a former Merchandiser, retail from his previous [employer] job where they are downsizing who may share in the development of the new office.  Sales are yet to be profitable, the patient is applying for second jobs to cover basic needs until the new business gets started.  At the same time he hopes to reduce or get off of his Lamictal as he has been stable in his emotion and behavior, coping well with the inflexibility of his immediate ex-wife particularly in regard to their infant son and proceeding divorce.  He is thereby applying skills from the past such as his work at [employer] to target the origin of his anxiety and depression for improved function and future.  He is compliant with his other medications needing Tranxene once daily generally but not sleeping well even with previous trazodone or Tranxene.  He has no mania, suicidality, psychosis, or delirium.  [place] registry documents last fills for Tranxene 07/18/2019 and 05/18/2019.  She continues therapy regularly with 3 sessions with Elio Forget in  the interim.  Patient has concluded that CBD oil is not helpful and melatonin is not helpful insomnia.  Depression        This is a recurrentproblem with the current episode started more than 1 year ago. The onset quality is sudden. The problem occurs intermittently.The problem has been gradually improvingsince onset.Associated symptoms include insomniaand episodically briefly sad. Associated symptoms include no decreased interest,no fatigue,no helplessness,no hopelessness,not irritable,no appetite change,no headachesand no suicidal ideas.The symptoms are aggravated by work stress, social issues and family issues.Past treatments include SSRIs - Selective serotonin reuptake inhibitors, SNRIs - Serotonin and norepinephrine reuptake inhibitors, psychotherapy and other medications.Compliance with treatment is good.Past compliance problems include medication issues and difficulty with treatment plan.Previous treatment provided moderaterelief.  Individual Medical History/ Review of Systems: Changes? :Yes Weight is down 4 pounds and he is working out with increased muscle.  Allergies: Patient has no known allergies.  Current Medications:  Current Outpatient Medications:  .  aluminum chloride (DRYSOL) 20 % external solution, Apply topically at bedtime., Disp: 35 mL, Rfl: 0 .  benzonatate (TESSALON) 200 MG capsule, Take by mouth., Disp: , Rfl:  .  busPIRone (BUSPAR) 10 MG tablet, Take 1 tablet (10 mg total) by mouth 3 (three) times daily., Disp: 90 tablet, Rfl: 2 .  cetirizine (ZYRTEC) 10 MG tablet, Take 10 mg by mouth daily., Disp: , Rfl:  .  clorazepate (TRANXENE) 3.75 MG tablet, Take 1 tablet (3.75 mg total) by mouth 2 (two) times daily as needed for anxiety., Disp: 60 tablet, Rfl: 1 .  DULoxetine (CYMBALTA) 30 MG capsule, Take 3  capsules (90 mg total) by mouth daily after breakfast., Disp: 90 capsule, Rfl: 2 .  lamoTRIgine (LAMICTAL) 100 MG tablet, Take 1 tablet (100 mg  total) by mouth at bedtime., Disp: 30 tablet, Rfl: 2 .  oseltamivir (TAMIFLU) 75 MG capsule, Take by mouth., Disp: , Rfl:  .  predniSONE (DELTASONE) 10 MG tablet, 40 mg x 3 days, 20 mg x 3 days, 10 mg x 3 days, Disp: 21 tablet, Rfl: 0 .  promethazine-dextromethorphan (PROMETHAZINE-DM) 6.25-15 MG/5ML syrup, TK 5 ML PO QID FOR UP TO 7 DAYS PRN, Disp: , Rfl:  .  zolpidem (AMBIEN) 10 MG tablet, Take 1 tablet (10 mg total) by mouth at bedtime., Disp: 30 tablet, Rfl: 2   Medication Side Effects: none unless Lamictal is limiting to emotional and cognitive energy and efficiency  Family Medical/ Social History: Changes? Yes has interest in friends and dating also being addressed in therapy as well, and he is networking on the job applying for second jobs.  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  MENTAL HEALTH EXAM:  Height 6' 2.5" (1.892 m), weight 252 lb (114.3 kg).Body mass index is 31.92 kg/m. Muscle strengths and tone 5/5, postural reflexes and gait 0/0, and AIMS = 0 otherwise deferred for coronavirus shutdown  General Appearance: Casual, Fairly Groomed, Meticulous and Obese   Eye Contact:  Good  Speech:  Clear and Coherent, Normal Rate and Talkative  Volume:  Normal  Mood:  Dysphoric and Euthymic and anxious  Affect:  Congruent, Depressed, Inappropriate, Full Range and Anxious  Thought Process:  Coherent, Goal Directed, Irrelevant and Descriptions of Associations: Tangential  Orientation:  Full (Time, Place, and Person)  Thought Content: Ilusions, Rumination and Tangential   Suicidal Thoughts:  No  Homicidal Thoughts:  No  Memory:  Immediate;   Good and Poor Remote;   Good  Judgement:  Fair  Insight:  Fair  Psychomotor Activity:  Normal and Mannerisms  Concentration:  Concentration: Good and Attention Span: Good  Recall:  Good  Fund of Knowledge: Good  Language: Good  Assets:  Desire for Improvement Leisure  Time Resilience Talents/Skills  ADL's:  Intact  Cognition: WNL  Prognosis:  Fair    DIAGNOSES:    ICD-10-CM   1. Recurrent major depression in partial remission (HCC)  F33.41 DULoxetine (CYMBALTA) 30 MG capsule    lamoTRIgine (LAMICTAL) 100 MG tablet    DISCONTINUED: lamoTRIgine (LAMICTAL) 100 MG tablet  2. Generalized anxiety disorder  F41.1 busPIRone (BUSPAR) 10 MG tablet    clorazepate (TRANXENE) 3.75 MG tablet    DULoxetine (CYMBALTA) 30 MG capsule    zolpidem (AMBIEN) 10 MG tablet  3. Avoidant personality disorder (HCC)  F60.6     Receiving Psychotherapy: Yes with El[facility] For[facility]ris Andrews, [facility]   RECOMMENDATIONS: Psycho supportive psychoeducation integrates behavioral nutrition, sleep hygiene, social skills, and frustration management with symptom treatment medication management matching for prevention and monitoring and safety hygiene.  In clarifying that symptoms are currently minimal other than stressors of subsequent marital dissolution and business starting in time of coronavirus shutdown, patient has concern that Lamictal is now less necessary so that the mechanism of stabilization is more limiting of energy and efficacy than necessary.  We concur to reduce Lamictal from 150 to 100 mg nightly sent as #30 with 2 refills E scribed to [facility] [address] for major depression with option to reduce to 1/2 tablet or 50 mg after 1 to 2 months if definite efficacy  by dose reduction and no instability in mood.  Cymbalta is continued 30 mg taking 3 capsules total 90 mg every morning sent as #90 with 2 refills to [facility] [address] for anxiety and depression.  As per 10 mg 3 times daily #90 with 2 refills is sent to [facility] for generalized anxiety and avoidant character.  Tranxene 3.75 mg twice daily as needed for panic level anxiety is sent #60 with 1 refill to [facility] for GAD.  Ambien is started 10 mg nightly #30 with 2 refills for insomnia associated with major depression, avoidant character,  and GAD with education on parasomnia, sedation, and interaction possible with environment and substances, though he has stopped CBD oil.  He returns in 3 months.   Delight Hoh, MD

## 2019-08-16 ENCOUNTER — Other Ambulatory Visit: Payer: Self-pay | Admitting: Psychiatry

## 2019-08-16 DIAGNOSIS — F411 Generalized anxiety disorder: Secondary | ICD-10-CM

## 2019-08-16 DIAGNOSIS — F3341 Major depressive disorder, recurrent, in partial remission: Secondary | ICD-10-CM

## 2019-10-19 ENCOUNTER — Ambulatory Visit: Payer: Commercial Managed Care - PPO | Admitting: Adult Health

## 2019-10-19 ENCOUNTER — Other Ambulatory Visit: Payer: Self-pay

## 2019-11-02 ENCOUNTER — Other Ambulatory Visit: Payer: Self-pay

## 2019-11-02 ENCOUNTER — Ambulatory Visit (INDEPENDENT_AMBULATORY_CARE_PROVIDER_SITE_OTHER): Payer: Commercial Managed Care - PPO | Admitting: Psychiatry

## 2019-11-02 ENCOUNTER — Encounter: Payer: Self-pay | Admitting: Psychiatry

## 2019-11-02 VITALS — Ht 74.5 in | Wt 256.0 lb

## 2019-11-02 DIAGNOSIS — F411 Generalized anxiety disorder: Secondary | ICD-10-CM

## 2019-11-02 DIAGNOSIS — F3341 Major depressive disorder, recurrent, in partial remission: Secondary | ICD-10-CM

## 2019-11-02 DIAGNOSIS — F606 Avoidant personality disorder: Secondary | ICD-10-CM

## 2019-11-02 MED ORDER — CLORAZEPATE DIPOTASSIUM 7.5 MG PO TABS: 3.7500 mg | ORAL_TABLET | Freq: Two times a day (BID) | ORAL | 3 refills | Status: DC | PRN

## 2019-11-02 MED ORDER — ZOLPIDEM TARTRATE 10 MG PO TABS: 10.0000 mg | ORAL_TABLET | Freq: Every day | ORAL | 3 refills | Status: DC

## 2019-11-02 MED ORDER — BUSPIRONE HCL 10 MG PO TABS: 10.0000 mg | ORAL_TABLET | Freq: Three times a day (TID) | ORAL | 5 refills | Status: DC

## 2019-11-02 MED ORDER — DULOXETINE HCL 30 MG PO CPEP: 90.0000 mg | ORAL_CAPSULE | Freq: Every day | ORAL | 5 refills | Status: DC

## 2019-11-02 NOTE — Progress Notes (Signed)
[facility]  Patient ID: Aaron [name],  MRN: [id number]  PCP: Shirline Frees, NP  Date of Evaluation: 11/02/2019 Time spent:20 minutes from 0940 to 1000  Chief Complaint:  Chief Complaint    Depression; Anxiety; Paranoid      HISTORY/CURRENT STATUS: Aaron [name] is seen onsite in office 20 minutes face-to-face individually with consent with epic collateral for psychiatric interview and exam in 43-month evaluation and management of major depression, generalized anxiety, and avoidant personality.  The patient's combined psychosocial limitation on problem solving acutely and over time continue to leave him somewhat frustrated with work and family but he makes gradual progress in coping without consequences.  His residential treatment at the [facility] engenders expectations of other problems and diagnoses whether with family or providers becoming quite conflictual he frustrating to previous therapist doing better in his current therapy.  Differential of bipolar, ADHD, and OCD of not been determined and any of these treatment settings.  However he functions best with medications that are also specific to such symptom complexes.  He tapered and discontinued Lamictal from last appointment and has been off of Saphris the last year following hospitalization now 22 months ago.  He is currently doing well with the Cymbalta, Ambien, and Tranxene though benzene dose is not sufficient for times of high anxiety or agitation and melatonin helps on less significant nights of insomnia taking the Ambien on the average every 2 to 3 days.  [place] registry documents last Ambien fill being 09/15/2019 as his second fill of last eScription and his last Tranxene was 10/18/2019 as the second fill from last appointment.  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  He is having the most  difficulty with divorce with current wife protracted as the courts are delaying and she does not allow him much time with their son though he has adequate time with children from previous marriage with Sue Lush.  He goes hiking exercise and tolerates the limitations of family.  He has no mania, suicidality, psychosis or delirium.   Depression This is a recurrentproblemwith thecurrent episode starting more than 3 year ago. The onset quality is sudden. The problem occurs intermittently.The problem has been gradually improvingsince onset.Associated symptoms include insomnia, dissatisfaction and unfulfillment, rejection sensitiviy,and episodically sad. Associated symptoms include no decreased interest,no fatigue,no helplessness,no hopelessness,not irritable,no appetite change,no headachesand no suicidal ideas.The symptoms are aggravated by work stress, social issues and family issues.Past treatments include SSRIs - Selective serotonin reuptake inhibitors, SNRIs - Serotonin and norepinephrine reuptake inhibitors, psychotherapy and other medications.Compliance with treatment is good.Past compliance problems include medication issues and difficulty with treatment plan.Previous treatment provided moderaterelief.  Individual Medical History/ Review of Systems: Changes? :Yes He has the MTHFR enzyme deficiency and takes l-methylfolate 15 mg daily  Allergies: Patient has no known allergies.  Current Medications:  Current Outpatient Medications:  .  aluminum chloride (DRYSOL) 20 % external solution, Apply topically at bedtime., Disp: 35 mL, Rfl: 0 .  benzonatate (TESSALON) 200 MG capsule, Take by mouth., Disp: , Rfl:  .  busPIRone (BUSPAR) 10 MG tablet, Take 1 tablet (10 mg total) by mouth 3 (three) times daily., Disp: 90 tablet, Rfl: 5 .  cetirizine (ZYRTEC) 10 MG tablet, Take 10 mg by mouth daily., Disp: , Rfl:  .  clorazepate (TRANXENE) 7.5 MG tablet, Take 0.5 tablets  (3.75 mg total) by mouth 2 (two) times  daily as needed for anxiety., Disp: 60 tablet, Rfl: 3 .  DULoxetine (CYMBALTA) 30 MG capsule, Take 3 capsules (90 mg total) by mouth daily after breakfast., Disp: 90 capsule, Rfl: 5 .  oseltamivir (TAMIFLU) 75 MG capsule, Take by mouth., Disp: , Rfl:  .  predniSONE (DELTASONE) 10 MG tablet, 40 mg x 3 days, 20 mg x 3 days, 10 mg x 3 days, Disp: 21 tablet, Rfl: 0 .  promethazine-dextromethorphan (PROMETHAZINE-DM) 6.25-15 MG/5ML syrup, TK 5 ML PO QID FOR UP TO 7 DAYS PRN, Disp: , Rfl:  .  zolpidem (AMBIEN) 10 MG tablet, Take 1 tablet (10 mg total) by mouth at bedtime., Disp: 30 tablet, Rfl: 3   Medication Side Effects: none  Family Medical/ Social History: Changes? No  MENTAL HEALTH EXAM:  Height 6' 2.5" (1.892 m), weight 256 lb (116.1 kg).Body mass index is 32.43 kg/m. Muscle strengths and tone 5/5, postural reflexes and gait 0/0, and AIMS = 0 otherwise deferred for coronavirus shutdown  General Appearance: Casual, Fairly Groomed, Guarded and Meticulous  Eye Contact:  Fair  Speech:  Clear and Coherent, Normal Rate and Talkative  Volume:  Normal  Mood:  Anxious, Dysphoric and Euthymic  Affect:  Congruent, Inappropriate, Restricted and Anxious  Thought Process:  Coherent, Goal Directed, Irrelevant and Descriptions of Associations: Tangential  Orientation:  Full (Time, Place, and Person)  Thought Content: Ilusions, Rumination and Tangential   Suicidal Thoughts:  No  Homicidal Thoughts:  No  Memory:  Immediate;   Good Remote;   Good  Judgement:  Fair  Insight:  Fair  Psychomotor Activity:  Normal, Increased, Mannerisms and Restlessness  Concentration:  Concentration: Good and Attention Span: Good  Recall:  Good  Fund of Knowledge: Good  Language: Fair  Assets:  Leisure Time Resilience Talents/Skills  ADL's:  Intact  Cognition: WNL  Prognosis:  Fair    DIAGNOSES:    ICD-10-CM   1. Recurrent major depression in partial remission (HCC)   F33.41 DULoxetine (CYMBALTA) 30 MG capsule  2. Generalized anxiety disorder  F41.1 clorazepate (TRANXENE) 7.5 MG tablet    DULoxetine (CYMBALTA) 30 MG capsule    busPIRone (BUSPAR) 10 MG tablet    zolpidem (AMBIEN) 10 MG tablet  3. Avoidant personality disorder ([place])  F60.6     Receiving Psychotherapy: Yes With Lanetta Inch, 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  His more profound depressions associated with unresolvable conflicts likely surrounding his avoidant personality have now subsided in time such that he is off of the Saphris and Lamictal.  He uses melatonin some nights and other nights requires Ambien after he has not slept well in 2 to 3 days being E scribed Ambien 10 mg nightly #30 with 3 refills sent to [facility] for insomnia associated with generalized anxiety.  He has increased on Tranxene from 3.75 to 7.5 mg twice daily as needed for anxiety generalized anxiety sent as #660 with 3 refills to [facility] [address].Duloxetine is maintained at 30 mg capsule taking 3 capsules total 90 mg daily after breakfast #90 with 5 refills sent to [facility].  He returns for follow-up in 6 months or sooner if needed as he continues psychotherapy individually.   Delight Hoh, MD

## 2019-11-03 ENCOUNTER — Ambulatory Visit (INDEPENDENT_AMBULATORY_CARE_PROVIDER_SITE_OTHER): Payer: Commercial Managed Care - PPO | Admitting: Mental Health

## 2019-11-03 ENCOUNTER — Other Ambulatory Visit: Payer: Self-pay

## 2019-11-03 DIAGNOSIS — F3341 Major depressive disorder, recurrent, in partial remission: Secondary | ICD-10-CM

## 2019-11-03 NOTE — Progress Notes (Signed)
[facility] Counselor/Therapist Progress Note   Patient ID: Aaron [name], MRN: [id number]  Date: 11/03/2019  Timespent: 51 minutes                      Treatment Type: Individual therapy  Mental Status Exam:   Appearance:   Casual     Behavior:  Appropriate  Motor:  WNL  Speech/Language:   Normal Rate  Affect:  Full range  Mood:  Congruent, anxious  Thought process:  Logical, linear, goal directed  Thought content:    No SI/HI/AVH, coherent, relevent  Perceptual disturbances:    none  Orientation:  Full (Time, Place, and Person)  Attention:  Good  Concentration:  good  Memory:  Immediate  Fund of knowledge:   Good  Insight:    Good  Judgment:   Good  Impulse Control:  Good   Reported Symptoms:   anxiety  Risk Assessment: Danger to Self:  No Self-injurious Behavior: No Danger to Others: No Duty to Warn:no Physical Aggression / Violence:No  Access to Firearms a concern: No  Gang Involvement:No   Patient / guardian was educated about steps to take if suicide or homicide risk level increases between visits. While future psychiatric events cannot be accurately predicted, the patient does not currently require acute inpatient psychiatric care and does not currently meet [facility] involuntary commitment criteria.  Subjective:  Patient engaged in session.  Shared progress since her last session which was about 2 months ago.  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  Shared family relationships with his children.  Continues to play an active role in his daughters lives seeing them throughout the week.  Continues to see his son weekly with limited contact due to [place].  Shared feelings related as he does not get to have him come to his home and worries about the  effect of his being so isolated during this time may have on him in the future and in their relationship.  He shared how he has tried to speak to his wife with him he remains separated but to no avail.  Provide support and encouragement as he shared how he continues to take steps toward self-care such as exercise throughout the week.  Stated that he has been able to go on a few dates recently with someone and this has been pleasant.  Patient shared how is helpful to have some levels of support as this has been minimal particularly over the last year and him starting a business.  Interventions:CBT, Solution Focused and Strength-based  Diagnosis:   ICD-10-CM   1. Recurrent major depression in partial remission ([place])  F33.41       Plan: Patient is to use CBT, mindfulness and coping skills to help manage decrease symptoms. Patient is to continue to take steps toward self-care with a focus on his exercise regimen to keep stress low and assist with sleep.    Long-term goal:  Reduce overall level, frequency, and intensity of the feelings of depression, anxiety up to 80% of the time per patient report.  Short-term goal: To identify and process feelings related to recent life stressors, past painful events that increase worthless feelings and depressed mood.  Verbally express understanding of the relationship between depressed mood and repression of feelings - such as anger, hurt, and sadness.                              Verbalize an understanding of the role that distorted thinking plays in creating fears, excessive worry, and ruminations.  Assessment of progress:  progressing   Waldron Session, [facility]

## 2019-11-22 ENCOUNTER — Ambulatory Visit (INDEPENDENT_AMBULATORY_CARE_PROVIDER_SITE_OTHER): Payer: Commercial Managed Care - PPO | Admitting: Mental Health

## 2019-11-22 ENCOUNTER — Other Ambulatory Visit: Payer: Self-pay

## 2019-11-22 DIAGNOSIS — F3341 Major depressive disorder, recurrent, in partial remission: Secondary | ICD-10-CM | POA: Diagnosis not present

## 2019-11-22 NOTE — Progress Notes (Signed)
[facility] Counselor/Therapist Progress Note   Patient ID: Aaron [name], MRN: [id number]  Date: 11/22/2019  Timespent: 52 minutes                      Treatment Type: Individual therapy  Mental Status Exam:   Appearance:   Casual     Behavior:  Appropriate  Motor:  WNL  Speech/Language:   Normal Rate  Affect:  Full range  Mood:  Congruent, anxious  Thought process:  Logical, linear, goal directed  Thought content:    No SI/HI/AVH, coherent, relevent  Perceptual disturbances:    none  Orientation:  Full (Time, Place, and Person)  Attention:  Good  Concentration:  good  Memory:  Immediate  Fund of knowledge:   Good  Insight:    Good  Judgment:   Good  Impulse Control:  Good   Reported Symptoms:   Anxiety, depression, rumination  Risk Assessment: Danger to Self:  No Self-injurious Behavior: No Danger to Others: No Duty to Warn:no Physical Aggression / Violence:No  Access to Firearms a concern: No  Gang Involvement:No   Patient / guardian was educated about steps to take if suicide or homicide risk level increases between visits. While future psychiatric events cannot be accurately predicted, the patient does not currently require acute inpatient psychiatric care and does not currently meet [facility] involuntary commitment criteria.  Subjective:  Patient engaged in session in no distress.  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  He shared efforts he has made to plan and organize various aspects of his business and hopes to finalize most in about 1 month.  He identified through discovery the unmet need of having emotional support, some sense of validation from his parents.  He went on to share how he and his wife have a court date tomorrow to finalize custody.  He stated they remain separated and he continues to see his son on weekends for about an hour.  He expresses how he continues to need more time with his son to build upon, closer relationship  and worries at times about the lack of contact they have had over the past year.  He shared how he is trying to abide by his wife's wishes which was to have less contact due to the pandemic but the emotional distress is caused him has been significant at times.  He remains hopeful about the future and having more time with his son and build the relationship.  He continues to have consistent contact with his daughters.  We explored the possibility of his having a discussion with his mother who is his strongest support about some feelings identified today.  He plans to consider between sessions.   Interventions:CBT, Solution Focused and Strength-based  Diagnosis:   ICD-10-CM   1. Recurrent major depression in partial remission ([place])  F33.41       Plan: Patient is to use CBT, mindfulness and coping skills to help manage decrease symptoms. Patient is to continue to take steps toward self-care with a focus on his exercise regimen to keep stress low and assist with sleep.    Long-term goal:  Reduce overall level, frequency, and intensity of the feelings of depression, anxiety up to 80% of the time per patient report.  Short-term goal: To identify and process feelings related to recent life stressors, past painful events that increase worthless feelings and depressed mood.  Verbally express understanding of the relationship between depressed mood and repression of feelings - such as anger, hurt, and sadness.                              Verbalize an understanding of the role that distorted thinking plays in creating fears, excessive worry, and ruminations.  Assessment of progress:  progressing   Waldron Session, [facility]

## 2019-12-07 ENCOUNTER — Other Ambulatory Visit: Payer: Self-pay | Admitting: Psychiatry

## 2019-12-07 DIAGNOSIS — F411 Generalized anxiety disorder: Secondary | ICD-10-CM

## 2019-12-13 ENCOUNTER — Other Ambulatory Visit: Payer: Self-pay

## 2019-12-13 ENCOUNTER — Ambulatory Visit (INDEPENDENT_AMBULATORY_CARE_PROVIDER_SITE_OTHER): Payer: Commercial Managed Care - PPO | Admitting: Mental Health

## 2019-12-13 DIAGNOSIS — F411 Generalized anxiety disorder: Secondary | ICD-10-CM | POA: Diagnosis not present

## 2019-12-13 DIAGNOSIS — F3341 Major depressive disorder, recurrent, in partial remission: Secondary | ICD-10-CM

## 2019-12-13 NOTE — Progress Notes (Signed)
[facility] Counselor/Therapist Progress Note   Patient ID: Aaron [name], MRN: [id number]  Date: 12/13/2019  Timespent: 55 minutes                      Treatment Type: Individual therapy  Mental Status Exam:   Appearance:   Casual     Behavior:  Appropriate  Motor:  WNL  Speech/Language:   Normal Rate  Affect:  Full range  Mood:  Congruent, euthymic  Thought process:  Logical, linear, goal directed  Thought content:    No SI/HI/AVH, coherent, relevent  Perceptual disturbances:    none  Orientation:  Full (Time, Place, and Person)  Attention:  Good  Concentration:  good  Memory:  Immediate  Fund of knowledge:   Good  Insight:    Good  Judgment:   Good  Impulse Control:  Good   Reported Symptoms:   Anxiety, depression, rumination  Risk Assessment: Danger to Self:  No Self-injurious Behavior: No Danger to Others: No Duty to Warn:no Physical Aggression / Violence:No  Access to Firearms a concern: No  Gang Involvement:No   Patient / guardian was educated about steps to take if suicide or homicide risk level increases between visits. While future psychiatric events cannot be accurately predicted, the patient does not currently require acute inpatient psychiatric care and does not currently meet [facility] involuntary commitment criteria.  Subjective:  Patient arrived on time for today's session.  He shared progress.  Most of the session was centered around the recent outcome of the custody court hearing involving his son.  He shared details from the hearing and how the outcome was where he can spend time with his son weekly, progressing in length month over a month for review in September 2021.  Patient stated that he is pleased with the outcome at this point, concerns primarily about his son about having time with him or his sisters over the last year.  He stated this will now allow his children to spend time with 1 another and their time as a family.  He shared the  challenges over the past year due to having such limited time with his son and how he looks forward to this change.  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  Patient appears hopeful about his future appearing highly motivated.  Ways to continue to cope and care for himself were explored, reviewed.  Patient shared how he plans to continue to make time for exercise as this is one of his outlets for stress.   Interventions:CBT, Solution Focused and Strength-based  Diagnosis:   ICD-10-CM   1. Recurrent major depression in partial remission (HCC)  F33.41   2. Generalized anxiety disorder  F41.1       Plan: Patient is to use CBT, mindfulness and coping skills to help manage decrease symptoms. Patient is to continue to take steps toward self-care with a focus on his exercise regimen to keep stress low and assist with sleep.    Long-term goal:  Reduce overall level, frequency, and intensity of the feelings of depression, anxiety up to 80% of the time per patient report.  Short-term goal: To identify and process feelings related to recent life stressors, past painful events that increase worthless feelings and depressed mood.                   Verbally express understanding of  the relationship between depressed mood and repression of feelings - such as anger, hurt, and sadness.                              Verbalize an understanding of the role that distorted thinking plays in creating fears, excessive worry, and ruminations.  Assessment of progress:  progressing   Anson Oregon, [facility]

## 2019-12-27 ENCOUNTER — Other Ambulatory Visit: Payer: Self-pay

## 2019-12-27 ENCOUNTER — Ambulatory Visit (INDEPENDENT_AMBULATORY_CARE_PROVIDER_SITE_OTHER): Payer: Commercial Managed Care - PPO | Admitting: Mental Health

## 2019-12-27 DIAGNOSIS — F411 Generalized anxiety disorder: Secondary | ICD-10-CM | POA: Diagnosis not present

## 2019-12-27 NOTE — Progress Notes (Signed)
[facility] Counselor/Therapist Progress Note   Patient ID: Aaron [name], MRN: [id number]  Date: 12/27/2019  Timespent: 55 minutes                      Treatment Type: Individual therapy  Mental Status Exam:   Appearance:   Casual     Behavior:  Appropriate  Motor:  WNL  Speech/Language:   Normal Rate  Affect:  Full range  Mood:  Congruent, euthymic  Thought process:  Logical, linear, goal directed  Thought content:    No SI/HI/AVH, coherent, relevent  Perceptual disturbances:    none  Orientation:  Full (Time, Place, and Person)  Attention:  Good  Concentration:  good  Memory:  Immediate  Fund of knowledge:   Good  Insight:    Good  Judgment:   Good  Impulse Control:  Good   Reported Symptoms:   Anxiety, depression, rumination  Risk Assessment: Danger to Self:  No Self-injurious Behavior: No Danger to Others: No Duty to Warn:no Physical Aggression / Violence:No  Access to Firearms a concern: No  Gang Involvement:No   Patient / guardian was educated about steps to take if suicide or homicide risk level increases between visits. While future psychiatric events cannot be accurately predicted, the patient does not currently require acute inpatient psychiatric care and does not currently meet [facility] involuntary commitment criteria.  Subjective:  Patient presents on time, no distress.  Shared prep shared progress.  He shared that she had recent interactions with his ex-wife related to his son.  He shared some affective ways he is trying to communicate to continue to coparent with a focus on his son's wellbeing as well as his daughters.  Shared how he is mindful of some thoughts that can lead to more anxiety, trying to keep this in check and has done well per his report.  Assisted him in this process throughout the session, reviewing identifying reasonable and pragmatic thoughts versus anxiety producing ones; encouraged him to continue between sessions.   Relationship with his other ex-wife was also shared, the coparent successfully with minimal stress state in this relationship.  He went on to share other life changes, focusing on his romantic relationships.  He ultimately would like to find a life partner which shares his interest as well as values, he is hopeful.  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  Parents have been supportive during this time continues to have some strained relationship issues with issues with his father.  Through discovery identified needing more emotional support in his life from his support system which primarily at this point all his parents.  He has made efforts to spend time with friends recently, how this has been enjoyable particularly due to the last year due to the pandemic.  Interventions:CBT, Solution Focused and Strength-based  Diagnosis:   ICD-10-CM   1. Generalized anxiety disorder  F41.1       Plan: Patient is to use CBT, mindfulness and coping skills to help manage decrease symptoms.  Patient to follow through on coping skills as discussed above.  Patient is to continue to take steps toward self-care with a focus on his exercise regimen to keep stress low and assist with sleep.    Long-term goal:  Reduce overall level, frequency, and intensity of the feelings of depression, anxiety up to 80% of the time per patient report.  Short-term goal:  To identify and process feelings related to recent life stressors, past painful events that increase worthless feelings and depressed mood.                   Verbally express understanding of the relationship between depressed mood and repression of feelings - such as anger, hurt, and sadness.                              Verbalize an understanding of the role that distorted thinking plays in creating fears, excessive worry, and ruminations.  Assessment of progress:  progressing    Anson Oregon, [facility]

## 2019-12-30 ENCOUNTER — Ambulatory Visit: Payer: Commercial Managed Care - PPO | Attending: Internal Medicine

## 2019-12-30 DIAGNOSIS — Z23 Encounter for immunization: Secondary | ICD-10-CM

## 2019-12-30 NOTE — Progress Notes (Signed)
   Covid-19 Vaccination Clinic  Name:  Aaron [name]    MRN: [id number] DOB: 1976/09/16  12/30/2019  [name] was observed post Covid-19 immunization for 15 minutes without incident. He was provided with Vaccine Information Sheet and instruction to access the V-Safe system.   [name] was instructed to call 911 with any severe reactions post vaccine: Marland Kitchen Difficulty breathing  . Swelling of face and throat  . A fast heartbeat  . A bad rash all over body  . Dizziness and weakness   Immunizations Administered    Name Date Dose VIS Date Route   Pfizer COVID-19 Vaccine 12/30/2019  8:45 AM 0.3 mL 09/23/2019 Intramuscular   Manufacturer: [employer], [employer]   Lot: [id number]   NDC: [address]-8

## 2020-01-03 ENCOUNTER — Other Ambulatory Visit: Payer: Self-pay | Admitting: Family Medicine

## 2020-01-19 ENCOUNTER — Other Ambulatory Visit: Payer: Self-pay

## 2020-01-19 ENCOUNTER — Ambulatory Visit (INDEPENDENT_AMBULATORY_CARE_PROVIDER_SITE_OTHER): Payer: Commercial Managed Care - PPO | Admitting: Mental Health

## 2020-01-19 DIAGNOSIS — F411 Generalized anxiety disorder: Secondary | ICD-10-CM | POA: Diagnosis not present

## 2020-01-19 NOTE — Progress Notes (Signed)
[facility] Counselor/Therapist Progress Note   Patient ID: Aaron [name], MRN: [id number]  Date: 01/19/2020  Timespent: 53 minutes                      Treatment Type: Individual therapy  Mental Status Exam:   Appearance:   Casual     Behavior:  Appropriate  Motor:  WNL  Speech/Language:   Normal Rate  Affect:  Full range  Mood:  Congruent, euthymic  Thought process:  Logical, linear, goal directed  Thought content:    No SI/HI/AVH, coherent, relevent  Perceptual disturbances:    none  Orientation:  Full (Time, Place, and Person)  Attention:  Good  Concentration:  good  Memory:  Immediate  Fund of knowledge:   Good  Insight:    Good  Judgment:   Good  Impulse Control:  Good   Reported Symptoms:   Anxiety, depression, rumination  Risk Assessment: Danger to Self:  No Self-injurious Behavior: No Danger to Others: No Duty to Warn:no Physical Aggression / Violence:No  Access to Firearms a concern: No  Gang Involvement:No   Patient / guardian was educated about steps to take if suicide or homicide risk level increases between visits. While future psychiatric events cannot be accurately predicted, the patient does not currently require acute inpatient psychiatric care and does not currently meet [facility] involuntary commitment criteria.  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 Remains hopeful and motivated while also seeking other employment opportunities to ensure that he has continued financial stability. The relationship with is ex-wife was shared, how he is been able to set some boundaries was discussed with some detail. He identified being able to articulate his feelings clearly without their engaging in arguments. He is focused on having an effective co-parenting relationship with her. identified feelings related to needing more support over the past  year would have been helpful while trying to start a new business. Explored ways to continue to communicate in this relationship in setting some boundaries when needed to keep stress low and promote effective co-parenting.   Interventions:CBT, Solution Focused and Strength-based  Diagnosis:   ICD-10-CM   1. Generalized anxiety disorder  F41.1       Plan: Patient is to use CBT, mindfulness and coping skills to help manage decrease symptoms.  Patient to follow through on coping skills as discussed above.  Patient is to continue to take steps toward self-care with a focus on his exercise regimen to keep stress low and assist with sleep.    Long-term goal:  Reduce overall level, frequency, and intensity of the feelings of depression, anxiety up to 80% of the time per patient report.  Short-term goal: To identify and process feelings related to recent life stressors, past painful events that increase worthless feelings and depressed mood.                   Verbally express understanding of the relationship between depressed mood and repression of feelings - such as anger, hurt, and sadness.                              Verbalize an understanding of the role that distorted thinking plays in creating fears, excessive worry, and ruminations.  Assessment of progress:  progressing   Cristal Deer  Rosana Berger, [facility]

## 2020-01-24 ENCOUNTER — Ambulatory Visit: Payer: Commercial Managed Care - PPO | Attending: Internal Medicine

## 2020-01-24 DIAGNOSIS — Z23 Encounter for immunization: Secondary | ICD-10-CM

## 2020-01-24 NOTE — Progress Notes (Signed)
   Covid-19 Vaccination Clinic  Name:  Aaron [name]    MRN: [id number] DOB: 02/21/1976  01/24/2020  Aaron [name] was observed post Covid-19 immunization for 15 minutes without incident. He was provided with Vaccine Information Sheet and instruction to access the V-Safe system.   Aaron [name] was instructed to call 911 with any severe reactions post vaccine: . Difficulty breathing  . Swelling of face and throat  . A fast heartbeat  . A bad rash all over body  . Dizziness and weakness   Immunizations Administered    Name Date Dose VIS Date Route   Pfizer COVID-19 Vaccine 01/24/2020  9:06 AM 0.3 mL 09/23/2019 Intramuscular   Manufacturer: Pfizer, Inc   Lot: [id number]   NDC: [address]-2     

## 2020-01-24 NOTE — Progress Notes (Signed)
   Covid-19 Vaccination Clinic  Name:  Aaron [name]    MRN: [id number] DOB: 10/09/1976  01/24/2020  Mr. Aaron [name] was observed post Covid-19 immunization for 15 minutes without incident. He was provided with Vaccine Information Sheet and instruction to access the V-Safe system.   Mr. Aaron [name] was instructed to call 911 with any severe reactions post vaccine: Marland Kitchen Difficulty breathing  . Swelling of face and throat  . A fast heartbeat  . A bad rash all over body  . Dizziness and weakness   Immunizations Administered    Name Date Dose VIS Date Route   Pfizer COVID-19 Vaccine 01/24/2020  9:06 AM 0.3 mL 09/23/2019 Intramuscular   Manufacturer: [employer], [employer]   Lot: [id number]   NDC: [address]-3

## 2020-02-02 ENCOUNTER — Ambulatory Visit (INDEPENDENT_AMBULATORY_CARE_PROVIDER_SITE_OTHER): Payer: Self-pay | Admitting: Mental Health

## 2020-02-02 ENCOUNTER — Other Ambulatory Visit: Payer: Self-pay

## 2020-02-02 DIAGNOSIS — F411 Generalized anxiety disorder: Secondary | ICD-10-CM
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
## 2020-02-16 ENCOUNTER — Ambulatory Visit: Payer: Commercial Managed Care - PPO | Admitting: Mental Health

## 2020-03-01 ENCOUNTER — Ambulatory Visit: Payer: Self-pay | Admitting: Mental Health

## 2020-04-26 ENCOUNTER — Other Ambulatory Visit: Payer: Self-pay

## 2020-04-26 ENCOUNTER — Ambulatory Visit (INDEPENDENT_AMBULATORY_CARE_PROVIDER_SITE_OTHER): Payer: BC Managed Care – PPO | Admitting: Mental Health

## 2020-04-26 DIAGNOSIS — F411 Generalized anxiety disorder: Secondary | ICD-10-CM | POA: Diagnosis not present
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
## 2020-04-30 DIAGNOSIS — Z20822 Contact with and (suspected) exposure to covid-19: Secondary | ICD-10-CM | POA: Diagnosis not present

## 2020-05-01 ENCOUNTER — Other Ambulatory Visit: Payer: Self-pay

## 2020-05-01 ENCOUNTER — Encounter: Payer: Self-pay | Admitting: Psychiatry

## 2020-05-01 ENCOUNTER — Ambulatory Visit (INDEPENDENT_AMBULATORY_CARE_PROVIDER_SITE_OTHER): Payer: BC Managed Care – PPO | Admitting: Psychiatry

## 2020-05-01 VITALS — Ht 74.5 in | Wt 244.0 lb

## 2020-05-01 DIAGNOSIS — F3341 Major depressive disorder, recurrent, in partial remission: Secondary | ICD-10-CM | POA: Diagnosis not present

## 2020-05-01 DIAGNOSIS — F411 Generalized anxiety disorder: Secondary | ICD-10-CM | POA: Diagnosis not present

## 2020-05-01 MED ORDER — BUSPIRONE HCL 10 MG PO TABS: 10.0000 mg | ORAL_TABLET | Freq: Three times a day (TID) | ORAL | 5 refills | Status: DC

## 2020-05-01 MED ORDER — DULOXETINE HCL 30 MG PO CPEP: 90.0000 mg | ORAL_CAPSULE | Freq: Every day | ORAL | 5 refills | Status: DC

## 2020-05-01 NOTE — Progress Notes (Signed)
[facility]  Patient ID: Jethro Radke,  MRN: [id number]  PCP: Shirline Frees, NP  Date of Evaluation: 05/01/2020 Time spent:20 minutes from 0950 to 1010  Chief Complaint:  Chief Complaint    Depression; Anxiety; Stress      HISTORY/CURRENT STATUS: Hearl is seen onsite in office 20 minutes face-to-face individually with consent with epic collateral for psychiatric interview and exam in 28-month evaluation and management of major depression and generalized anxiety with history consistent with avoidant personality now significantly worked through.  Patient has lost 12 pounds with Shaklee and keto diet with girlfriend French Ana exercising as much as time will allow.  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  He has one next appearance in family court in September finding most recently ex-wife giving him mixed messages as though supportive and helpful at times though not to be trusted, but currently they are collaborating better in most ways regarding their son.  He is not certain they will need the family court to resolve their issues from here but stays prepared if his wife needs that.  His wife prior to that is deployed to [place] so their children are with her currently visiting except Delaney having borderline COVID test for which she was flown back to [facility] for retesting.  Patient therefore has a number of issues to address none of which are requiring much Tranxene as one half of a 7.5 mg tablet or Ambien 10 mg at night.   registry documents last dispensing of both was 02/20/2020 still having at least a half bottle left if not more in his supply and doubting any  further need.  He will continue Cymbalta and BuSpar through this time of change to return in 6 months though anticipating himself that he may be able to switch over to homeopathics such as his l-methylfolate and B12 currently underway at the end of the year.  He has no mania, suicidality, psychosis or delirium.  Depression This is a recurrentproblemwith thecurrent episode starting more than2 yearsago. The onset quality is sudden. The problem occurs intermittently.The problem has been gradually improvingsince onset.Associated symptoms includeinsomnia, dissatisfaction, unfulfillment, rejection sensitiviy, excessive worry,andepisodicallysad. Associated symptoms include no decreased interest,no fatigue,no helplessness,no hopelessness,not irritable,no mania, no psychosis, no appetite change,no headachesand no suicidal ideas.The symptoms are aggravated by work stress, social issues and family issues.Past treatments include SSRIs - Selective serotonin reuptake inhibitors, SNRIs - Serotonin and norepinephrine reuptake inhibitors, psychotherapy and other medications.Compliance with treatment is good.Past compliance problems include medication issues and difficulty with treatment plan.Previous treatment provided moderaterelief.  Individual Medical History/ Review of Systems: Changes? :Yes Weight is down 12 pounds in 6 months with exercise and diet especially from his girlfriend.  Also current outdoors heat limits his exercise.  He uses melatonin OTC for insomnia at times  Allergies: Patient has no known allergies.  Current Medications:  Current Outpatient Medications:    azelastine (ASTELIN) 0.1 % nasal spray, Place 2 sprays into both nostrils 2 (two) times daily., Disp: , Rfl:    busPIRone (BUSPAR) 10 MG tablet, Take 1 tablet (10 mg total) by mouth 3 (three) times daily., Disp: 90 tablet, Rfl: 5   cetirizine (ZYRTEC) 10 MG tablet, Take  10 mg by mouth daily., Disp:  , Rfl:    clorazepate (TRANXENE) 7.5 MG tablet, Take 0.5 tablets (3.75 mg total) by mouth 2 (two) times daily as needed for anxiety., Disp: 60 tablet, Rfl: 3   DULoxetine (CYMBALTA) 30 MG capsule, Take 3 capsules (90 mg total) by mouth daily after breakfast., Disp: 90 capsule, Rfl: 5   montelukast (SINGULAIR) 10 MG tablet, Take 10 mg by mouth daily., Disp: , Rfl:    zolpidem (AMBIEN) 10 MG tablet, Take 1 tablet (10 mg total) by mouth at bedtime as needed for sleep., Disp: 30 tablet, Rfl: 3  Medication Side Effects: none  Family Medical/ Social History: Changes? No  MENTAL HEALTH EXAM:  Height 6' 2.5" (1.892 m), weight 244 lb (110.7 kg).Body mass index is 30.91 kg/m. Muscle strengths and tone 5/5, postural reflexes and gait 0/0, and AIMS = 0.  General Appearance: Casual, Meticulous and Well Groomed  Eye Contact:  Good  Speech:  Clear and Coherent, Normal Rate and Talkative  Volume:  Normal  Mood:  Anxious, Dysphoric and Euthymic  Affect:  Congruent, Inappropriate, Full Range and Anxious  Thought Process:  Coherent, Goal Directed, Irrelevant and Descriptions of Associations: Tangential  Orientation:  Full (Time, Place, and Person)  Thought Content: Rumination and Tangential   Suicidal Thoughts:  No  Homicidal Thoughts:  No  Memory:  Immediate;   Good Remote;   Good  Judgement:  Fair  Insight:  Good  Psychomotor Activity:  Normal and Mannerisms  Concentration:  Concentration: Good and Attention Span: Good  Recall:  Good  Fund of Knowledge: Good  Language: Fair  Assets:  Desire for Improvement Leisure Time Resilience Talents/Skills  ADL's: Intact  Cognition: WNL  Prognosis:  Fair    DIAGNOSES:    ICD-10-CM   1. Recurrent major depression in partial remission (HCC)  F33.41 DULoxetine (CYMBALTA) 30 MG capsule  2. Generalized anxiety disorder  F41.1 DULoxetine (CYMBALTA) 30 MG capsule    busPIRone (BUSPAR) 10 MG tablet    zolpidem (AMBIEN) 10 MG tablet    Receiving  Psychotherapy: Yes   With Elio Forget, LPC    RECOMMENDATIONS: Psychosupportive psychoeducation is integrated with  egoanalytic review clarifying that many features of his avoidant personality are worked through still dealing with consequences from the past but present and future are constructively positive.  As he reviews the time course of current medications, we continue current Cymbalta and BuSpar foremost as Ambien and Tranxene are as needed and infrequent.  I do not recommend that homeopathics be his replacement for current treatment but rather his next step after mastery of current issues and consequences as currently for diet and exercise.  Long-term therapy may also be most helpful.  He is E scribed Cymbalta 30 mg capsule has 3 capsules total 90 mg every morning after breakfast sent as #90 with 5 refills to [facility] [address] for anxiety and depression.Marland Kitchen  He continues BuSpar 10 mg 3 times daily sent as #90 with 5 refills to [facility] [address] for generalized anxiety.  He has current supply of Tranxene 7.5 mg tablets taking 1/2 tablet twice daily as needed for anxiety of generalized type.  He is E scribed Ambien 10 mg at bedtime if needed for insomnia associated with anxiety and depression sent as #30 with 3 refills to [facility] [address].  He returns for follow-up in 6 months or sooner if needed.   Chauncey Mann, MD

## 2020-05-14 ENCOUNTER — Telehealth: Payer: Self-pay | Admitting: Mental Health

## 2020-05-14 NOTE — Telephone Encounter (Signed)
L/m to CN appt on 05/24/20 at 11am. Requested Pt call back to r/s.

## 2020-05-24 ENCOUNTER — Ambulatory Visit: Payer: BC Managed Care – PPO | Admitting: Mental Health

## 2020-07-04 DIAGNOSIS — M9901 Segmental and somatic dysfunction of cervical region: Secondary | ICD-10-CM | POA: Diagnosis not present

## 2020-07-04 DIAGNOSIS — M9906 Segmental and somatic dysfunction of lower extremity: Secondary | ICD-10-CM | POA: Diagnosis not present

## 2020-07-04 DIAGNOSIS — M5386 Other specified dorsopathies, lumbar region: Secondary | ICD-10-CM | POA: Diagnosis not present

## 2020-07-04 DIAGNOSIS — M531 Cervicobrachial syndrome: Secondary | ICD-10-CM | POA: Diagnosis not present

## 2020-07-04 DIAGNOSIS — M7612 Psoas tendinitis, left hip: Secondary | ICD-10-CM | POA: Diagnosis not present

## 2020-07-04 DIAGNOSIS — M9903 Segmental and somatic dysfunction of lumbar region: Secondary | ICD-10-CM | POA: Diagnosis not present

## 2020-07-10 DIAGNOSIS — M9901 Segmental and somatic dysfunction of cervical region: Secondary | ICD-10-CM | POA: Diagnosis not present

## 2020-07-10 DIAGNOSIS — M5386 Other specified dorsopathies, lumbar region: Secondary | ICD-10-CM | POA: Diagnosis not present

## 2020-07-10 DIAGNOSIS — M7612 Psoas tendinitis, left hip: Secondary | ICD-10-CM | POA: Diagnosis not present

## 2020-07-10 DIAGNOSIS — M531 Cervicobrachial syndrome: Secondary | ICD-10-CM | POA: Diagnosis not present

## 2020-07-10 DIAGNOSIS — M9903 Segmental and somatic dysfunction of lumbar region: Secondary | ICD-10-CM | POA: Diagnosis not present

## 2020-07-10 DIAGNOSIS — M9906 Segmental and somatic dysfunction of lower extremity: Secondary | ICD-10-CM | POA: Diagnosis not present

## 2020-07-19 DIAGNOSIS — M9903 Segmental and somatic dysfunction of lumbar region: Secondary | ICD-10-CM | POA: Diagnosis not present

## 2020-07-19 DIAGNOSIS — M531 Cervicobrachial syndrome: Secondary | ICD-10-CM | POA: Diagnosis not present

## 2020-07-19 DIAGNOSIS — M9901 Segmental and somatic dysfunction of cervical region: Secondary | ICD-10-CM | POA: Diagnosis not present

## 2020-07-19 DIAGNOSIS — M9906 Segmental and somatic dysfunction of lower extremity: Secondary | ICD-10-CM | POA: Diagnosis not present

## 2020-07-19 DIAGNOSIS — M7612 Psoas tendinitis, left hip: Secondary | ICD-10-CM | POA: Diagnosis not present

## 2020-07-19 DIAGNOSIS — M5386 Other specified dorsopathies, lumbar region: Secondary | ICD-10-CM | POA: Diagnosis not present

## 2020-07-24 DIAGNOSIS — M9903 Segmental and somatic dysfunction of lumbar region: Secondary | ICD-10-CM | POA: Diagnosis not present

## 2020-07-24 DIAGNOSIS — M531 Cervicobrachial syndrome: Secondary | ICD-10-CM | POA: Diagnosis not present

## 2020-07-24 DIAGNOSIS — M9906 Segmental and somatic dysfunction of lower extremity: Secondary | ICD-10-CM | POA: Diagnosis not present

## 2020-07-24 DIAGNOSIS — M7612 Psoas tendinitis, left hip: Secondary | ICD-10-CM | POA: Diagnosis not present

## 2020-07-24 DIAGNOSIS — M5386 Other specified dorsopathies, lumbar region: Secondary | ICD-10-CM | POA: Diagnosis not present

## 2020-07-24 DIAGNOSIS — M9901 Segmental and somatic dysfunction of cervical region: Secondary | ICD-10-CM | POA: Diagnosis not present

## 2020-07-31 DIAGNOSIS — M531 Cervicobrachial syndrome: Secondary | ICD-10-CM | POA: Diagnosis not present

## 2020-07-31 DIAGNOSIS — M9901 Segmental and somatic dysfunction of cervical region: Secondary | ICD-10-CM | POA: Diagnosis not present

## 2020-07-31 DIAGNOSIS — M7612 Psoas tendinitis, left hip: Secondary | ICD-10-CM | POA: Diagnosis not present

## 2020-07-31 DIAGNOSIS — M9903 Segmental and somatic dysfunction of lumbar region: Secondary | ICD-10-CM | POA: Diagnosis not present

## 2020-07-31 DIAGNOSIS — M9906 Segmental and somatic dysfunction of lower extremity: Secondary | ICD-10-CM | POA: Diagnosis not present

## 2020-07-31 DIAGNOSIS — M5386 Other specified dorsopathies, lumbar region: Secondary | ICD-10-CM | POA: Diagnosis not present

## 2020-08-02 ENCOUNTER — Encounter: Payer: Self-pay | Admitting: Psychiatry

## 2020-08-06 DIAGNOSIS — M5386 Other specified dorsopathies, lumbar region: Secondary | ICD-10-CM | POA: Diagnosis not present

## 2020-08-06 DIAGNOSIS — M7612 Psoas tendinitis, left hip: Secondary | ICD-10-CM | POA: Diagnosis not present

## 2020-08-06 DIAGNOSIS — M9901 Segmental and somatic dysfunction of cervical region: Secondary | ICD-10-CM | POA: Diagnosis not present

## 2020-08-06 DIAGNOSIS — M9906 Segmental and somatic dysfunction of lower extremity: Secondary | ICD-10-CM | POA: Diagnosis not present

## 2020-08-06 DIAGNOSIS — M9903 Segmental and somatic dysfunction of lumbar region: Secondary | ICD-10-CM | POA: Diagnosis not present

## 2020-08-06 DIAGNOSIS — M531 Cervicobrachial syndrome: Secondary | ICD-10-CM | POA: Diagnosis not present

## 2020-09-11 ENCOUNTER — Ambulatory Visit: Payer: BC Managed Care – PPO | Admitting: Psychiatry

## 2020-09-24 ENCOUNTER — Ambulatory Visit: Payer: BC Managed Care – PPO | Admitting: Mental Health

## 2020-11-01 ENCOUNTER — Ambulatory Visit: Payer: BC Managed Care – PPO | Admitting: Psychiatry

## 2021-01-01 ENCOUNTER — Ambulatory Visit: Payer: BC Managed Care – PPO | Admitting: Adult Health

## 2021-01-01 ENCOUNTER — Encounter: Payer: Self-pay | Admitting: Adult Health

## 2021-01-01 ENCOUNTER — Other Ambulatory Visit: Payer: Self-pay

## 2021-01-01 VITALS — BP 130/90 | HR 101 | Temp 97.7°F | Wt 254.2 lb

## 2021-01-01 DIAGNOSIS — G479 Sleep disorder, unspecified: Secondary | ICD-10-CM | POA: Diagnosis not present

## 2021-01-01 NOTE — Progress Notes (Signed)
Subjective:    Patient ID: Aaron [name], male    DOB: 04-21-1976, 45 y.o.   MRN: [id number]  HPI 45 year old male who  has a past medical history of Allergy, Anxiety, Chicken pox, and Depression.  He presents to the office today for for concern of sleep disturbance.  He has a documented history of insomnia, was on Ambien but has not taken this for multiple years.  He feels as though this is something different than the insomnia that he has experienced in the past.  He does snore, "some nights worse than others".  He does wake up multiple times throughout the night and short stents.  He is unsure if he is having any apneic episodes.  Some nights he has trouble falling back asleep.  Is practicing good sleep hygiene.  Does not feel rested when he wakes up and feels as though he could take a nap most afternoons.  His weight is up approximately 10 pounds.   Review of Systems See HPI   Past Medical History:  Diagnosis Date  . Allergy   . Anxiety   . Chicken pox   . Depression     Social History   Socioeconomic History  . Marital status: Married    Spouse name: Not on file  . Number of children: Not on file  . Years of education: Not on file  . Highest education level: Not on file  Occupational History  . Not on file  Tobacco Use  . Smoking status: Never Smoker  . Smokeless tobacco: Never Used  Vaping Use  . Vaping Use: Former  Substance and Sexual Activity  . Alcohol use: Yes    Alcohol/week: 12.0 standard drinks    Types: 12 Cans of beer per week    Comment: some increase with increased anxiety  . Drug use: No  . Sexual activity: Not Currently  Other Topics Concern  . Not on file  Social History Narrative   Bachelors Degree, Administrator, arts for [employer].    Married    Two daughters, one daughter on the way    He likes to sleep    Social Determinants of Health   Financial Resource Strain: Not on file  Food Insecurity: Not on file  Transportation Needs:  Not on file  Physical Activity: Not on file  Stress: Not on file  Social Connections: Not on file  Intimate Partner Violence: Not on file    No past surgical history on file.  Family History  Problem Relation Age of Onset  . Heart disease Father   . Depression Maternal Grandmother        commited suicide    No Known Allergies  Current Outpatient Medications on File Prior to Visit  Medication Sig Dispense Refill  . azelastine (ASTELIN) 0.1 % nasal spray Place 2 sprays into both nostrils 2 (two) times daily. (Patient not taking: Reported on 01/01/2021)    . busPIRone (BUSPAR) 10 MG tablet Take 1 tablet (10 mg total) by mouth 3 (three) times daily. (Patient not taking: Reported on 01/01/2021) 90 tablet 5  . cetirizine (ZYRTEC) 10 MG tablet Take 10 mg by mouth daily. (Patient not taking: Reported on 01/01/2021)    . clorazepate (TRANXENE) 7.5 MG tablet Take 0.5 tablets (3.75 mg total) by mouth 2 (two) times daily as needed for anxiety. (Patient not taking: Reported on 01/01/2021) 60 tablet 3  . DULoxetine (CYMBALTA) 30 MG capsule Take 3 capsules (90 mg total) by mouth daily  after breakfast. (Patient not taking: Reported on 01/01/2021) 90 capsule 5  . montelukast (SINGULAIR) 10 MG tablet Take 10 mg by mouth daily. (Patient not taking: Reported on 01/01/2021)    . zolpidem (AMBIEN) 10 MG tablet Take 1 tablet (10 mg total) by mouth at bedtime as needed for sleep. (Patient not taking: Reported on 01/01/2021) 30 tablet 3   No current facility-administered medications on file prior to visit.    BP 130/90 (BP Location: Left Arm, Patient Position: Sitting, Cuff Size: Normal)   Pulse (!) 101   Temp 97.7 F (36.5 C) (Oral)   Wt 254 lb 3.2 oz (115.3 kg)   SpO2 98%   BMI 32.20 kg/m       Objective:   Physical Exam Vitals and nursing note reviewed.  Constitutional:      Appearance: Normal appearance.  Cardiovascular:     Rate and Rhythm: Normal rate and regular rhythm.     Pulses: Normal  pulses.     Heart sounds: Normal heart sounds.  Pulmonary:     Effort: Pulmonary effort is normal.     Breath sounds: Normal breath sounds.  Musculoskeletal:        General: Normal range of motion.  Skin:    General: Skin is warm.     Capillary Refill: Capillary refill takes less than 2 seconds.  Neurological:     General: No focal deficit present.     Mental Status: He is alert and oriented to person, place, and time.  Psychiatric:        Mood and Affect: Mood normal.        Thought Content: Thought content normal.        Judgment: Judgment normal.        Assessment & Plan:  1. Sleep disturbance - There is concern for sleep apnea. Will refer to pulmonary for sleep study  - Ambulatory referral to Pulmonology  Shirline Frees, NP

## 2021-01-22 ENCOUNTER — Other Ambulatory Visit: Payer: Self-pay

## 2021-01-22 ENCOUNTER — Other Ambulatory Visit (HOSPITAL_BASED_OUTPATIENT_CLINIC_OR_DEPARTMENT_OTHER): Payer: Self-pay

## 2021-01-22 ENCOUNTER — Emergency Department (HOSPITAL_BASED_OUTPATIENT_CLINIC_OR_DEPARTMENT_OTHER)
Admission: EM | Admit: 2021-01-22 | Discharge: 2021-01-22 | Disposition: A | Discharge status: Home / Self Care | Payer: BC Managed Care – PPO | Attending: Emergency Medicine | Admitting: Emergency Medicine

## 2021-01-22 ENCOUNTER — Encounter (HOSPITAL_BASED_OUTPATIENT_CLINIC_OR_DEPARTMENT_OTHER): Payer: Self-pay | Admitting: Emergency Medicine

## 2021-01-22 DIAGNOSIS — T7840XA Allergy, unspecified, initial encounter: Secondary | ICD-10-CM | POA: Diagnosis not present

## 2021-01-22 DIAGNOSIS — R059 Cough, unspecified: Secondary | ICD-10-CM | POA: Diagnosis not present

## 2021-01-22 DIAGNOSIS — U071 COVID-19: Secondary | ICD-10-CM | POA: Diagnosis not present

## 2021-01-22 MED ORDER — PREDNISONE 20 MG PO TABS
ORAL_TABLET | ORAL | 0 refills | Status: DC
  Filled 2021-01-22: qty 11, 5d supply, fill #0

## 2021-01-22 MED ORDER — PREDNISONE 20 MG PO TABS: ORAL_TABLET | ORAL | 0 refills | Status: DC

## 2021-01-22 NOTE — ED Triage Notes (Signed)
Pt arrives pov with report of allergic reaction that started yesterday afternoon. Pt endorses facial swelling, redness and itching, especially on proximal  RLE. Pt also reports itching in throat. Took 25mg  benadryl at 0700 today. Pt also Covid +, tested on Sunday, endorses cough.

## 2021-01-22 NOTE — ED Provider Notes (Signed)
[facility] EMERGENCY DEPARTMENT Provider Note   CSN: [id number] Arrival date & time: 01/22/21  [id number]     History Chief Complaint  Patient presents with  . Allergic Reaction    Aaron [name] is a 45 y.o. male.  Patient is a 45 year old male who presents with rash and itching.  He was positive for Covid 3 days ago.  He started having symptoms just before that.  He had some runny nose congestion and coughing.  He also has some myalgias.  No fevers.  He did start taking ivermectin and is taking 6 doses of it.  Overall he has been feeling better.  He does note that yesterday he started having swelling and itching to his face with a rash to his arms and legs.  Says it is pruritic.  He denies any swelling of his lips or tongue.  No difficulty swallowing.  No shortness of breath.  No history of similar reactions in the past.  He does not have any other known new exposures.  He was out with the pollen and is not sure if that triggered it.  He also ate some shrimp yesterday but has not previously had a shellfish allergy.  He has been tested for allergies in the past and they have been negative.        Past Medical History:  Diagnosis Date  . Allergy   . Anxiety   . Chicken pox   . Depression     Patient Active Problem List   Diagnosis Date Noted  . Generalized anxiety disorder 08/03/2018  . Recurrent major depression in partial remission (HCC) 01/08/2018    Past Surgical History:  Procedure Laterality Date  . DENTAL SURGERY         Family History  Problem Relation Age of Onset  . Heart disease Father   . Depression Maternal Grandmother        commited suicide    Social History   Tobacco Use  . Smoking status: Never Smoker  . Smokeless tobacco: Never Used  Vaping Use  . Vaping Use: Former  Substance Use Topics  . Alcohol use: Yes    Alcohol/week: 12.0 standard drinks    Types: 12 Cans of beer per week    Comment: some increase with increased anxiety  .  Drug use: No    Home Medications Prior to Admission medications   Medication Sig Start Date End Date Taking? Authorizing Provider  azelastine (ASTELIN) 0.1 % nasal spray Place 2 sprays into both nostrils 2 (two) times daily. Patient not taking: Reported on 01/01/2021 12/27/19   Irena Cords, Enzo Montgomery, MD  busPIRone (BUSPAR) 10 MG tablet Take 1 tablet (10 mg total) by mouth 3 (three) times daily. Patient not taking: Reported on 01/01/2021 05/01/20   Chauncey Mann, MD  cetirizine (ZYRTEC) 10 MG tablet Take 10 mg by mouth daily. Patient not taking: Reported on 01/01/2021    [provider]  clorazepate (TRANXENE) 7.5 MG tablet Take 0.5 tablets (3.75 mg total) by mouth 2 (two) times daily as needed for anxiety. Patient not taking: Reported on 01/01/2021 11/02/19   Chauncey Mann, MD  DULoxetine (CYMBALTA) 30 MG capsule Take 3 capsules (90 mg total) by mouth daily after breakfast. Patient not taking: Reported on 01/01/2021 05/01/20   Chauncey Mann, MD  montelukast (SINGULAIR) 10 MG tablet Take 10 mg by mouth daily. Patient not taking: Reported on 01/01/2021 12/27/19   [provider]  predniSONE (DELTASONE) 20 MG tablet  3 tabs po day one, then 2 po daily x 4 days 01/22/21   Rolan Bucco, MD  zolpidem (AMBIEN) 10 MG tablet Take 1 tablet (10 mg total) by mouth at bedtime as needed for sleep. Patient not taking: Reported on 01/01/2021 05/01/20   Chauncey Mann, MD    Allergies    Patient has no known allergies.  Review of Systems   Review of Systems  Constitutional: Negative for chills, diaphoresis, fatigue and fever.  HENT: Positive for facial swelling. Negative for congestion, rhinorrhea and sneezing.   Eyes: Negative.   Respiratory: Negative for cough, chest tightness and shortness of breath.   Cardiovascular: Negative for chest pain and leg swelling.  Gastrointestinal: Negative for abdominal pain, blood in stool, diarrhea, nausea and vomiting.  Genitourinary: Negative  for difficulty urinating, flank pain, frequency and hematuria.  Musculoskeletal: Negative for arthralgias and back pain.  Skin: Positive for rash.  Neurological: Negative for dizziness, speech difficulty, weakness, numbness and headaches.    Physical Exam Updated Vital Signs BP (!) 142/97 (BP Location: Right Arm)   Pulse 84   Temp 98.5 F (36.9 C) (Oral)   Resp 20   Ht 6\' 3"  (1.905 m)   Wt 113.4 kg   SpO2 96%   BMI 31.25 kg/m   Physical Exam Constitutional:      Appearance: He is well-developed.  HENT:     Head: Normocephalic and atraumatic.     Comments: Minor swelling around the eyes with some erythema to the face.    Mouth/Throat:     Comments: No angioedema noted, no trismus, uvula is midline Eyes:     Pupils: Pupils are equal, round, and reactive to light.  Cardiovascular:     Rate and Rhythm: Normal rate and regular rhythm.     Heart sounds: Normal heart sounds.  Pulmonary:     Effort: Pulmonary effort is normal. No respiratory distress.     Breath sounds: Normal breath sounds. No wheezing or rales.  Chest:     Chest wall: No tenderness.  Abdominal:     General: Bowel sounds are normal.     Palpations: Abdomen is soft.     Tenderness: There is no abdominal tenderness. There is no guarding or rebound.  Musculoskeletal:        General: Normal range of motion.     Cervical back: Normal range of motion and neck supple.  Lymphadenopathy:     Cervical: No cervical adenopathy.  Skin:    General: Skin is warm and dry.     Findings: Rash present.     Comments: Slightly raised erythematous rash to the upper extremities.  It is blanching.  No petechiae or purpura.  No vesicles.  Neurological:     Mental Status: He is alert and oriented to person, place, and time.     ED Results / Procedures / Treatments   Labs (all labs ordered are listed, but only abnormal results are displayed) Labs Reviewed - No data to display  EKG None  Radiology No results  found.  Procedures Procedures   Medications Ordered in ED Medications - No data to display  ED Course  I have reviewed the triage vital signs and the nursing notes.  Pertinent labs & imaging results that were available during my care of the patient were reviewed by me and considered in my medical decision making (see chart for details).    MDM Rules/Calculators/A&P  Patient presents with an allergic reaction.  I do not see any evidence of angioedema.  He has no shortness of breath.  He has taken some Benadryl with some improvement in symptoms.  We will start a prednisone pack.  He can continue the Benadryl as needed for itching.  Return precautions were given.  I did discuss that he could follow-up with an allergist for definitive testing.  He has previously seen an allergist.  He was counseled that there was not any data to support ivermectin use.  I did offer him testing here and possible referral to the monoclonal antibody clinic.  He says he is overall feeling better and declines this.  Return precautions were given. Final Clinical Impression(s) / ED Diagnoses Final diagnoses:  Allergic reaction, initial encounter  COVID-19 virus infection    Rx / DC Orders ED Discharge Orders         Ordered    predniSONE (DELTASONE) 20 MG tablet  Status:  Discontinued        01/22/21 0845    predniSONE (DELTASONE) 20 MG tablet        01/22/21 0845           Rolan Bucco, MD 01/22/21 [phone]

## 2021-01-24 ENCOUNTER — Telehealth: Payer: BC Managed Care – PPO | Admitting: Adult Health

## 2021-01-24 ENCOUNTER — Encounter: Payer: Self-pay | Admitting: Adult Health

## 2021-01-24 VITALS — Wt 252.0 lb

## 2021-01-24 DIAGNOSIS — T7840XA Allergy, unspecified, initial encounter: Secondary | ICD-10-CM | POA: Diagnosis not present

## 2021-01-24 DIAGNOSIS — U071 COVID-19: Secondary | ICD-10-CM

## 2021-01-24 NOTE — Progress Notes (Signed)
Virtual Visit via Video Note  I connected with Aaron [name] on 01/24/21 at 11:00 AM EDT by a video enabled telemedicine application and verified that I am speaking with the correct person using two identifiers.  Location patient: home Location provider:work or home office Persons participating in the virtual visit: patient, provider  I discussed the limitations of evaluation and management by telemedicine and the availability of in person appointments. The patient expressed understanding and agreed to proceed.   HPI:  He presents to the office today for follow-up after being seen in the emergency room 2 days ago with an allergic reaction.  He tested positive for COVID 19 3 days prior.  He did start taking invermectin, and took about 6 doses of it.  The day before being seen he started noticing swelling and itching to his face with a rash to his arms and legs.  He denied swelling of his lips or tongue.  Was not having any difficulty swallowing or breathing.  He was not sure of the trigger, was outside in the pollen as well as ate shrimp the day previous.  Does not have a known shellfish allergy.  In the ER he was prescribed a prednisone taper   Today he reports that overall he is feeling about 75% better when it comes to the allergic reaction and COVID-19 infection.  He continues to have a red itchy rash on his thigh but overall his rash has improved.  No longer having swelling around his eyes or throughout his face.  ROS: See pertinent positives and negatives per HPI.  Past Medical History:  Diagnosis Date  . Allergy   . Anxiety   . Chicken pox   . Depression     Past Surgical History:  Procedure Laterality Date  . DENTAL SURGERY      Family History  Problem Relation Age of Onset  . Heart disease Father   . Depression Maternal Grandmother        commited suicide       Current Outpatient Medications:  .  cetirizine (ZYRTEC) 10 MG tablet, Take 10 mg by mouth daily., Disp: ,  Rfl:  .  guaiFENesin (MUCINEX PO), Take by mouth as needed., Disp: , Rfl:  .  predniSONE (DELTASONE) 20 MG tablet, Take 3 tablets by mouth on day one, then take 2 tablets daily for 4 days (Patient taking differently: Take 3 tablets by mouth on day one, then take 2 tablets daily for 4 days), Disp: 11 tablet, Rfl: 0  EXAM:  VITALS per patient if applicable:  GENERAL: alert, oriented, appears well and in no acute distress  HEENT: atraumatic, conjunttiva clear, no obvious abnormalities on inspection of external nose and ears  NECK: normal movements of the head and neck  LUNGS: on inspection no signs of respiratory distress, breathing rate appears normal, no obvious gross SOB, gasping or wheezing  CV: no obvious cyanosis  MS: moves all visible extremities without noticeable abnormality  PSYCH/NEURO: pleasant and cooperative, no obvious depression or anxiety, speech and thought processing grossly intact  SKIN: right thigh is slightly red on camera   ASSESSMENT AND PLAN:  Discussed the following assessment and plan:  1. COVID-19 virus infection -Continue with rest and hydration.  Has improved significantly over the last week.  Follow-up as needed  2. Allergic reaction, initial encounter -Unknown trigger, could have been invermectin vs shellfish vs seasonal allergy mediated.  He has had allergy testing in the past, plans to follow-up with an allergist for  further testing. - Finish prednisone therapy as directed  Shirline Frees, NP        I discussed the assessment and treatment plan with the patient. The patient was provided an opportunity to ask questions and all were answered. The patient agreed with the plan and demonstrated an understanding of the instructions.   The patient was advised to call back or seek an in-person evaluation if the symptoms worsen or if the condition fails to improve as anticipated.   Shirline Frees, NP

## 2021-02-05 ENCOUNTER — Other Ambulatory Visit: Payer: Self-pay

## 2021-02-05 ENCOUNTER — Ambulatory Visit: Payer: BC Managed Care – PPO | Admitting: Adult Health

## 2021-02-05 ENCOUNTER — Encounter: Payer: Self-pay | Admitting: Adult Health

## 2021-02-05 VITALS — BP 140/90 | HR 91 | Temp 97.9°F | Wt 253.4 lb

## 2021-02-05 DIAGNOSIS — R030 Elevated blood-pressure reading, without diagnosis of hypertension: Secondary | ICD-10-CM | POA: Diagnosis not present

## 2021-02-05 DIAGNOSIS — T7840XA Allergy, unspecified, initial encounter: Secondary | ICD-10-CM | POA: Diagnosis not present

## 2021-02-05 DIAGNOSIS — T148XXA Other injury of unspecified body region, initial encounter: Secondary | ICD-10-CM

## 2021-02-05 DIAGNOSIS — Z832 Family history of diseases of the blood and blood-forming organs and certain disorders involving the immune mechanism: Secondary | ICD-10-CM | POA: Diagnosis not present

## 2021-02-05 MED ORDER — METHYLPREDNISOLONE ACETATE 80 MG/ML IJ SUSP
120.0000 mg | Freq: Once | INTRAMUSCULAR | Status: AC
  Administered 2021-02-05: 120 mg via INTRAMUSCULAR

## 2021-02-05 NOTE — Progress Notes (Signed)
Subjective:    Patient ID: Aaron [name], male    DOB: 04-25-1976, 45 y.o.   MRN: [id number]  HPI   He presents to the office today for follow up on multiple issues   1. Family history of Factor V deficiency -he reports that his mother, multiple aunts, and possibly a sister have all tested positive for factor V deficiency.  He denies history of DVT or any signs or symptoms in the past.  He has not noticed any clotting issues.    2. Red rash on right thigh -is been present over the last 2 weeks.  He was seen in the emergency room earlier this month after a an allergic reaction to either shellfish or ivermectin that he took for a Covid infection.  He had swelling and itching to his face and a rash to his arms and legs.  Prescribed a prednisone taper and all of his symptoms resolved except for a red itchy rash on his right thigh.  Home he has been taking Benadryl and using Benadryl cream without relief. Denies changes in detergents, soaps, or lotions   3. Elevated Blood pressure readings -reports at home he has been getting readings in the 130s to 140s over 80s to 90s.  Denies symptoms.  Has not been active but recently bought a rowing machine and plans to start getting more aerobic exercise. BP Readings from Last 3 Encounters:  02/05/21 140/90  01/22/21 (!) 135/95  01/01/21 130/90     Review of Systems See HPI   Past Medical History:  Diagnosis Date  . Allergy   . Anxiety   . Chicken pox   . Depression     Social History   Socioeconomic History  . Marital status: Married    Spouse name: Not on file  . Number of children: Not on file  . Years of education: Not on file  . Highest education level: Not on file  Occupational History  . Not on file  Tobacco Use  . Smoking status: Never Smoker  . Smokeless tobacco: Never Used  Vaping Use  . Vaping Use: Former  Substance and Sexual Activity  . Alcohol use: Yes    Alcohol/week: 12.0 standard drinks    Types: 12 Cans of beer  per week    Comment: some increase with increased anxiety  . Drug use: No  . Sexual activity: Not Currently  Other Topics Concern  . Not on file  Social History Narrative   Bachelors Degree, Administrator, arts for [employer].    Married    Two daughters, one daughter on the way    He likes to sleep    Social Determinants of Health   Financial Resource Strain: Not on file  Food Insecurity: Not on file  Transportation Needs: Not on file  Physical Activity: Not on file  Stress: Not on file  Social Connections: Not on file  Intimate Partner Violence: Not on file    Past Surgical History:  Procedure Laterality Date  . DENTAL SURGERY      Family History  Problem Relation Age of Onset  . Heart disease Father   . Depression Maternal Grandmother        commited suicide    Allergies  Allergen Reactions  . Other     Seasonal allergies, pollen and dander    Current Outpatient Medications on File Prior to Visit  Medication Sig Dispense Refill  . cetirizine (ZYRTEC) 10 MG tablet Take 10 mg by mouth daily.    Marland Kitchen  guaiFENesin (MUCINEX PO) Take by mouth as needed.     No current facility-administered medications on file prior to visit.    BP 140/90 (BP Location: Right Arm, Patient Position: Sitting, Cuff Size: Normal)   Pulse 91   Temp 97.9 F (36.6 C) (Oral)   Wt 253 lb 6.4 oz (114.9 kg)   SpO2 97%   BMI 31.67 kg/m       Objective:   Physical Exam Vitals and nursing note reviewed.  Constitutional:      Appearance: Normal appearance.  Musculoskeletal:        General: No swelling or tenderness.     Right lower leg: No edema.     Left lower leg: No edema.  Skin:    General: Skin is warm and dry.     Findings: Bruising (located on right thigh ( around the rash) and on right lateral knee), erythema and rash present. Rash is urticarial.          Comments: Patches of red raised and flat areas on right thigh    Neurological:     General: No focal deficit  present.     Mental Status: He is alert and oriented to person, place, and time.  Psychiatric:        Mood and Affect: Mood normal.        Behavior: Behavior normal.        Thought Content: Thought content normal.        Judgment: Judgment normal.       Assessment & Plan:  1. Family history of factor V deficiency - May need referral to Hematology or genetic screening  - Factor V Leiden; Future - Factor 5 leiden; Future - Factor 5 leiden  2. Allergic reaction, initial encounter  - methylPREDNISolone acetate (DEPO-MEDROL) injection 120 mg  3. Elevated blood pressure reading - Continue to monitor  - Encouraged lifestyle modifications   4. Bruising  - Iron and TIBC; Future  [employer]

## 2021-02-06 LAB — IBC PANEL
Iron: 160 ug/dL (ref 42–165)
Saturation Ratios: 58.6 % — ABNORMAL HIGH (ref 20.0–50.0)
Transferrin: 195 mg/dL — ABNORMAL LOW (ref 212.0–360.0)

## 2021-02-12 NOTE — Progress Notes (Signed)
02/13/21- 44 yoM never smoker for sleep evaluation courtesy of Shirline Frees, NP.with concern of OSA. Medical problem list includes Depression, Anxiety, Covid infection April,2022,  Epworth score-7 Body weight today-253 lbs Covid infection- recent Covid vax- 2 Phizer -----Sleep referral-c/o "very broken sleep", snoring, wakes up freq. Wife tells him of snoring. He is aware of fragmented sleep with frequent arousals. Some daytime tiredness. No ENT surgery, heart or lung disease. Does note nasal congestion "allergy". Herbal sleep aid occasionally.  1-2 cups coffee, 1 soda at lunch. Brother has OSA. Works day shift job.  Prior to Admission medications   Medication Sig Start Date End Date Taking? Authorizing Provider  cetirizine (ZYRTEC) 10 MG tablet Take 10 mg by mouth daily.   Yes [provider]  guaiFENesin (MUCINEX PO) Take by mouth as needed. Patient not taking: Reported on 02/13/2021    [provider]   Past Medical History:  Diagnosis Date  . Allergic rhinitis   . Allergy   . Anxiety   . Chicken pox   . Depression    Past Surgical History:  Procedure Laterality Date  . DENTAL SURGERY     Family History  Problem Relation Age of Onset  . Heart disease Father   . Depression Maternal Grandmother        commited suicide   Social History   Socioeconomic History  . Marital status: Married    Spouse name: Not on file  . Number of children: Not on file  . Years of education: Not on file  . Highest education level: Not on file  Occupational History  . Not on file  Tobacco Use  . Smoking status: Never Smoker  . Smokeless tobacco: Never Used  Vaping Use  . Vaping Use: Former  Substance and Sexual Activity  . Alcohol use: Yes    Alcohol/week: 12.0 standard drinks    Types: 12 Cans of beer per week    Comment: some increase with increased anxiety  . Drug use: No  . Sexual activity: Not Currently  Other Topics Concern  . Not on file  Social History  Narrative   Bachelors Degree, Administrator, arts for [employer].    Married    Two daughters, one daughter on the way    He likes to sleep    Social Determinants of Health   Financial Resource Strain: Not on file  Food Insecurity: Not on file  Transportation Needs: Not on file  Physical Activity: Not on file  Stress: Not on file  Social Connections: Not on file  Intimate Partner Violence: Not on file   ROS-see HPI   + = positive Constitutional:    weight loss, night sweats, fevers, chills, fatigue, lassitude. HEENT:    headaches, difficulty swallowing, tooth/dental problems, sore throat,       sneezing, itching, ear ache,+ nasal congestion, post nasal drip, snoring CV:    chest pain, orthopnea, PND, swelling in lower extremities, anasarca,                                  dizziness, palpitations Resp:   +shortness of breath with exertion or at rest.                productive cough,   non-productive cough, coughing up of blood.              change in color of mucus.  wheezing.   Skin:  rash or lesions. GI:  No-   heartburn, indigestion, abdominal pain, nausea, vomiting, diarrhea,                 change in bowel habits, loss of appetite GU: dysuria, change in color of urine, no urgency or frequency.   flank pain. MS:   joint pain, stiffness, decreased range of motion, back pain. Neuro-     nothing unusual Psych:  change in mood or affect.  depression or anxiety.   memory loss.  OBJ- Physical Exam General- Alert, Oriented, Affect-appropriate, Distress- none acute, +tall Skin- rash-none, lesions- none, excoriation- none Lymphadenopathy- none Head- atraumatic            Eyes- Gross vision intact, PERRLA, conjunctivae and secretions clear            Ears- Hearing, canals-normal            Nose- Clear, no-Septal dev, mucus, polyps, erosion, perforation             Throat- Mallampati III-IV , mucosa clear , drainage- none, tonsils- atrophic, + teeth Neck- flexible , trachea  midline, no stridor , thyroid nl, carotid no bruit Chest - symmetrical excursion , unlabored           Heart/CV- RRR , no murmur , no gallop  , no rub, nl s1 s2                           - JVD- none , edema- none, stasis changes- none, varices- none           Lung- clear to P&A, wheeze- none, cough- none , dullness-none, rub- none           Chest wall-  Abd-  Br/ Gen/ Rectal- Not done, not indicated Extrem- cyanosis- none, clubbing, none, atrophy- none, strength- nl Neuro- grossly intact to observation

## 2021-02-13 ENCOUNTER — Ambulatory Visit: Payer: BC Managed Care – PPO | Admitting: Internal Medicine

## 2021-02-13 ENCOUNTER — Other Ambulatory Visit: Payer: Self-pay

## 2021-02-13 ENCOUNTER — Encounter: Payer: Self-pay | Admitting: Internal Medicine

## 2021-02-13 VITALS — BP 148/88 | HR 74 | Temp 98.1°F | Ht 75.0 in | Wt 253.4 lb

## 2021-02-13 DIAGNOSIS — J302 Other seasonal allergic rhinitis: Secondary | ICD-10-CM

## 2021-02-13 DIAGNOSIS — J3089 Other allergic rhinitis: Secondary | ICD-10-CM | POA: Diagnosis not present

## 2021-02-13 DIAGNOSIS — G4733 Obstructive sleep apnea (adult) (pediatric): Secondary | ICD-10-CM | POA: Insufficient documentation

## 2021-02-13 DIAGNOSIS — R0683 Snoring: Secondary | ICD-10-CM | POA: Insufficient documentation

## 2021-02-13 LAB — FACTOR 5 LEIDEN: Result: POSITIVE — AB

## 2021-02-13 NOTE — Assessment & Plan Note (Signed)
Suspect OSA. Appropriate discussion and questions. Plan- Sleep study then likely first choice would be CPAP.

## 2021-02-13 NOTE — Patient Instructions (Signed)
Order- schedule home sleep test  Dx Snoring  Please call us for results and recommendations about 2 weeks after your sleep test. 

## 2021-02-13 NOTE — Assessment & Plan Note (Signed)
May need to look at this more if it interferes with CPAP or aggravates OSA.

## 2021-02-14 ENCOUNTER — Other Ambulatory Visit: Payer: Self-pay | Admitting: Adult Health

## 2021-02-14 DIAGNOSIS — D6851 Activated protein C resistance: Secondary | ICD-10-CM

## 2021-02-15 ENCOUNTER — Telehealth: Payer: Self-pay | Admitting: *Deleted

## 2021-02-15 NOTE — Telephone Encounter (Signed)
Per 02/14/21 referral - called and lvm of upcoming appointments - mailed calendar with welcome packet

## 2021-03-06 ENCOUNTER — Telehealth: Payer: Self-pay | Admitting: *Deleted

## 2021-03-06 ENCOUNTER — Inpatient Hospital Stay: Payer: BC Managed Care – PPO | Attending: Family

## 2021-03-06 ENCOUNTER — Other Ambulatory Visit: Payer: Self-pay

## 2021-03-06 ENCOUNTER — Encounter: Payer: Self-pay | Admitting: Family

## 2021-03-06 ENCOUNTER — Inpatient Hospital Stay (HOSPITAL_BASED_OUTPATIENT_CLINIC_OR_DEPARTMENT_OTHER): Payer: BC Managed Care – PPO | Admitting: Family

## 2021-03-06 VITALS — BP 130/95 | HR 64 | Temp 98.0°F | Resp 17 | Ht 75.0 in | Wt 250.0 lb

## 2021-03-06 DIAGNOSIS — D6851 Activated protein C resistance: Secondary | ICD-10-CM | POA: Insufficient documentation

## 2021-03-06 NOTE — Progress Notes (Signed)
Hematology/Oncology Consultation   Name: Aaron [name]      MRN: [id number]    Location: Room/bed info not found  Date: 03/06/2021 Time:9:25 AM   REFERRING PHYSICIAN: Shirline Frees, NP  REASON FOR CONSULT: Factor V Leiden, heterozygous   DIAGNOSIS: Factor V Leiden, heterozygous for the R506Q mutation    HISTORY OF PRESENT ILLNESS: Aaron [name] is a very pleasant 45 yo caucasian gentleman with newly diagnosed Factor V Leiden, heterozygous for the R506Q mutation.  His mother as well as her siblings are all carriers of Factor V Leiden. His mother is the only one with history of thrombotic event (DVT's in lower extremities).     No known history of personal thrombotic event.  He had travelled to [place] last year and shortly after developed Covid. He had facial swelling and a red rash on the right calf during that time. He states that he was treated with a steroid dose pack and his symptoms resolved. No imaging done at the time.  He is asymptomatic now. No swelling. No positive Homans's sign.  Pedal pulses are 3+.  He denies injury or dehydration.  No steroid or herbal/work out supplementation use.  He is not a smoker. No recreational drug use.  He enjoys 2 beers each evening to help relax.  He thinks he has sleep apnea and is in the process of scheduling a sleep study.  He is not currently taking aspirin.  No episodes of blood loss. No abnormal bruising or petechiae.  No fever, chills, n/v, cough, rash, dizziness, chest pain, palpitations, abdominal pain or changes in bowel or bladder habits.  No swelling, numbness or tingling in his extremities.  He has some achyness and stiffness in his knees at times. He was previously a runner, up to 30 miles per week.  No falls or syncope to report.  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   ROS: All other 10 point review of systems is negative.   PAST MEDICAL HISTORY:   Past  Medical History:  Diagnosis Date  . Allergic rhinitis   . Allergy   . Anxiety   . Chicken pox   . Depression     ALLERGIES: Allergies  Allergen Reactions  . Other     Seasonal allergies, pollen and dander      MEDICATIONS:  Current Outpatient Medications on File Prior to Visit  Medication Sig Dispense Refill  . cetirizine (ZYRTEC) 10 MG tablet Take 10 mg by mouth daily.     No current facility-administered medications on file prior to visit.     PAST SURGICAL HISTORY Past Surgical History:  Procedure Laterality Date  . DENTAL SURGERY      FAMILY HISTORY: Family History  Problem Relation Age of Onset  . Heart disease Father   . Depression Maternal Grandmother        commited suicide    SOCIAL HISTORY:  reports that he has never smoked. He has never used smokeless tobacco. He reports current alcohol use of about 12.0 standard drinks of alcohol per week. He reports that he does not use drugs.  PERFORMANCE STATUS: [name]  PHYSICAL EXAM: Most Recent Vital Signs: Blood pressure (!) 130/95, pulse 64, temperature 98 F (36.7 C), temperature source Oral, resp. rate 17, height 6\' 3"  (1.905 m), weight 250 lb (113.4 kg), SpO2 100 %. BP (!) 130/95 (BP Location: Right Arm, Patient Position:  Sitting)   Pulse 64   Temp 98 F (36.7 C) (Oral)   Resp 17   Ht 6\' 3"  (1.905 m)   Wt 250 lb (113.4 kg)   SpO2 100%   BMI 31.25 kg/m   General Appearance:    Alert, cooperative, no distress, appears stated age  Head:    Normocephalic, without obvious abnormality, atraumatic  Eyes:    PERRL, conjunctiva/corneas clear, EOM's intact, fundi    benign, both eyes             Throat:   Lips, mucosa, and tongue normal; teeth and gums normal  Neck:   Supple, symmetrical, trachea midline, no adenopathy;       thyroid:  No enlargement/tenderness/nodules; no carotid   bruit or JVD  Back:     Symmetric, no curvature, ROM normal, no CVA  tenderness  Lungs:     Clear to auscultation bilaterally, respirations unlabored  Chest wall:    No tenderness or deformity  Heart:    Regular rate and rhythm, S1 and S2 normal, no murmur, rub   or gallop  Abdomen:     Soft, non-tender, bowel sounds active all four quadrants,    no masses, no organomegaly        Extremities:   Extremities normal, atraumatic, no cyanosis or edema  Pulses:   2+ and symmetric all extremities  Skin:   Skin color, texture, turgor normal, no rashes or lesions  Lymph nodes:   Cervical, supraclavicular, and axillary nodes normal  Neurologic:   CNII-XII intact. Normal strength, sensation and reflexes      throughout    LABORATORY DATA:  No results found for this or any previous visit (from the past 48 hour(s)).    RADIOGRAPHY: No results found.     PATHOLOGY: Factor V Leiden, heterozygous for the R506Q mutation    ASSESSMENT/PLAN: Aaron [name] is a very pleasant 45 yo caucasian gentleman with newly diagnosed Factor V Leiden, heterozygous for the R506Q mutation. No personal history of thrombotic event.  He will avoid hormone replacement and proceed with sleep apnea testing.  We advised that he start taking a full dose aspirin daily with food.  At this time will will follow-up only if needed if he were to develop a blood clot.   All questions were answered and he is in agreement. The patient knows to call the clinic with any problems, questions or concerns. We can certainly see him again if needed.   The patient was discussed with Dr. 43 and he is in agreement with the aforementioned.   Myna Hidalgo, NP

## 2021-03-06 NOTE — Telephone Encounter (Signed)
Per 03/05/21 los - Follow-up as needed for any future heme needs.

## 2021-03-07 ENCOUNTER — Ambulatory Visit: Payer: BC Managed Care – PPO

## 2021-03-07 DIAGNOSIS — R0683 Snoring: Secondary | ICD-10-CM

## 2021-03-07 DIAGNOSIS — G4733 Obstructive sleep apnea (adult) (pediatric): Secondary | ICD-10-CM | POA: Diagnosis not present

## 2021-03-15 DIAGNOSIS — G4733 Obstructive sleep apnea (adult) (pediatric): Secondary | ICD-10-CM | POA: Diagnosis not present

## 2021-03-16 DIAGNOSIS — R0683 Snoring: Secondary | ICD-10-CM

## 2021-03-16 NOTE — Telephone Encounter (Signed)
CY - please advise. Thanks! 

## 2021-03-18 NOTE — Telephone Encounter (Signed)
Home sleep test shoed moderately severe obstructive sleep apnea, averaging  15 apneas/ hour with drops in blood oxygen level.   I recommend we order new DME, new CPAP auto 5-20, mask of choice, humidifier, supplies, AirView/ card  Please make sure he has ROV in 31-90 days

## 2021-04-10 DIAGNOSIS — D1801 Hemangioma of skin and subcutaneous tissue: Secondary | ICD-10-CM | POA: Diagnosis not present

## 2021-04-10 DIAGNOSIS — L918 Other hypertrophic disorders of the skin: Secondary | ICD-10-CM | POA: Diagnosis not present

## 2021-04-10 DIAGNOSIS — L720 Epidermal cyst: Secondary | ICD-10-CM | POA: Diagnosis not present

## 2021-05-09 DIAGNOSIS — G4733 Obstructive sleep apnea (adult) (pediatric): Secondary | ICD-10-CM | POA: Diagnosis not present

## 2021-05-19 NOTE — Progress Notes (Signed)
HPI M never smoker followed for OSA, complicated by Depression, Anxiety, Covid infection April,2022, Factor V Leiden,  HST 03/07/21- AHI 15.1/ hr, desaturation to 89%, body weight 253 lbs  =========================================================  02/13/21- 44 yoM never smoker for sleep evaluation courtesy of [employer], NP.with concern of OSA. Medical problem list includes Depression, Anxiety, Covid infection April,2022,  Epworth score-7 Body weight today-253 lbs Covid infection- recent Covid vax- 2 Phizer -----Sleep referral-c/o "very broken sleep", snoring, wakes up freq. Wife tells him of snoring. He is aware of fragmented sleep with frequent arousals. Some daytime tiredness. No ENT surgery, heart or lung disease. Does note nasal congestion "allergy". Herbal sleep aid occasionally.  1-2 cups coffee, 1 soda at lunch. Brother has OSA. Works day shift job.  05/20/21- 65 yoM never smoker followed for OSA, complicated by Depression, Anxiety, Covid infection April,2022, Factor V Leiden,  HST 03/07/21- AHI 15.1/ hr, desaturation to 89%, body weight 253 lbs CPAP auto 5-20/ [facility] ordered 03/19/21  AirSense AutoSet 11 Download- AHI 2.7/ hr Body weight today- 253 lbs Just got machine. Nasal mask but mouth breather.   ROS-see HPI   + = positive Constitutional:    weight loss, night sweats, fevers, chills, fatigue, lassitude. HEENT:    headaches, difficulty swallowing, tooth/dental problems, sore throat,       sneezing, itching, ear ache,+ nasal congestion, post nasal drip, snoring CV:    chest pain, orthopnea, PND, swelling in lower extremities, anasarca,                                  dizziness, palpitations Resp:   +shortness of breath with exertion or at rest.                productive cough,   non-productive cough, coughing up of blood.              change in color of mucus.  wheezing.   Skin:    rash or lesions. GI:  No-   heartburn, indigestion, abdominal pain, nausea, vomiting,  diarrhea,                 change in bowel habits, loss of appetite GU: dysuria, change in color of urine, no urgency or frequency.   flank pain. MS:   joint pain, stiffness, decreased range of motion, back pain. Neuro-     nothing unusual Psych:  change in mood or affect.  depression or anxiety.   memory loss.  OBJ- Physical Exam General- Alert, Oriented, Affect-appropriate, Distress- none acute, +tall Skin- rash-none, lesions- none, excoriation- none Lymphadenopathy- none Head- atraumatic            Eyes- Gross vision intact, PERRLA, conjunctivae and secretions clear            Ears- Hearing, canals-normal            Nose- Clear, no-Septal dev, mucus, polyps, erosion, perforation             Throat- Mallampati III-IV , mucosa clear , drainage- none, tonsils- atrophic, + teeth Neck- flexible , trachea midline, no stridor , thyroid nl, carotid no bruit Chest - symmetrical excursion , unlabored           Heart/CV- RRR , no murmur , no gallop  , no rub, nl s1 s2                           -  JVD- none , edema- none, stasis changes- none, varices- none           Lung- clear to P&A, wheeze- none, cough- none , dullness-none, rub- none           Chest wall-  Abd-  Br/ Gen/ Rectal- Not done, not indicated Extrem- cyanosis- none, clubbing, none, atrophy- none, strength- nl Neuro- grossly intact to observation

## 2021-05-20 ENCOUNTER — Ambulatory Visit: Payer: BC Managed Care – PPO | Admitting: Internal Medicine

## 2021-05-20 ENCOUNTER — Other Ambulatory Visit: Payer: Self-pay

## 2021-05-20 ENCOUNTER — Encounter: Payer: Self-pay | Admitting: Internal Medicine

## 2021-05-20 VITALS — BP 120/80 | HR 85 | Temp 98.3°F | Ht 75.0 in | Wt 253.8 lb

## 2021-05-20 DIAGNOSIS — F411 Generalized anxiety disorder: Secondary | ICD-10-CM | POA: Diagnosis not present

## 2021-05-20 DIAGNOSIS — G4733 Obstructive sleep apnea (adult) (pediatric): Secondary | ICD-10-CM

## 2021-05-20 NOTE — Patient Instructions (Signed)
Order- DME Christoper Allegra- please change autopap range to 4-15 and refit mask for choices  Order- schedule mask fitting at sleep center  Please cal if we can help

## 2021-06-09 DIAGNOSIS — G4733 Obstructive sleep apnea (adult) (pediatric): Secondary | ICD-10-CM | POA: Diagnosis not present

## 2021-06-10 ENCOUNTER — Ambulatory Visit (HOSPITAL_BASED_OUTPATIENT_CLINIC_OR_DEPARTMENT_OTHER): Payer: BC Managed Care – PPO | Attending: Internal Medicine | Admitting: Internal Medicine

## 2021-06-10 DIAGNOSIS — G4733 Obstructive sleep apnea (adult) (pediatric): Secondary | ICD-10-CM

## 2021-06-11 ENCOUNTER — Other Ambulatory Visit: Payer: Self-pay

## 2021-07-10 DIAGNOSIS — G4733 Obstructive sleep apnea (adult) (pediatric): Secondary | ICD-10-CM | POA: Diagnosis not present

## 2021-08-06 NOTE — Progress Notes (Deleted)
HPI M never smoker followed for OSA, complicated by Depression, Anxiety, Covid infection April,2022, Factor V Leiden,  HST 03/07/21- AHI 15.1/ hr, desaturation to 89%, body weight 253 lbs  =========================================================  02/13/21- 44 yoM never smoker for sleep evaluation courtesy of [employer], NP.with concern of OSA. Medical problem list includes Depression, Anxiety, Covid infection April,2022,  Epworth score-7 Body weight today-253 lbs Covid infection- recent Covid vax- 2 Phizer -----Sleep referral-c/o "very broken sleep", snoring, wakes up freq. Wife tells him of snoring. He is aware of fragmented sleep with frequent arousals. Some daytime tiredness. No ENT surgery, heart or lung disease. Does note nasal congestion "allergy". Herbal sleep aid occasionally.  1-2 cups coffee, 1 soda at lunch. Brother has OSA. Works day shift job.  05/20/21- 62 yoM never smoker followed for OSA, complicated by Depression, Anxiety, Covid infection April,2022, Factor V Leiden,  HST 03/07/21- AHI 15.1/ hr, desaturation to 89%, body weight 253 lbs CPAP auto 5-20/ [facility] ordered 03/19/21  AirSense AutoSet 11 Download- AHI 2.7/ hr Body weight today- 253 lbs We are reducing range to 4-15 and referring for mask fitting  08/07/21- 44 yoM never smoker followed for OSA, complicated by Depression, Anxiety, Covid infection April,2022, Factor V Leiden,  CPAP auto 4-15/ [facility] Download-  Body weight today- Covid vax- Flu vax-   ROS-see HPI   + = positive Constitutional:    weight loss, night sweats, fevers, chills, fatigue, lassitude. HEENT:    headaches, difficulty swallowing, tooth/dental problems, sore throat,       sneezing, itching, ear ache,+ nasal congestion, post nasal drip, snoring CV:    chest pain, orthopnea, PND, swelling in lower extremities, anasarca,                                  dizziness, palpitations Resp:   +shortness of breath with exertion or at rest.                 productive cough,   non-productive cough, coughing up of blood.              change in color of mucus.  wheezing.   Skin:    rash or lesions. GI:  No-   heartburn, indigestion, abdominal pain, nausea, vomiting, diarrhea,                 change in bowel habits, loss of appetite GU: dysuria, change in color of urine, no urgency or frequency.   flank pain. MS:   joint pain, stiffness, decreased range of motion, back pain. Neuro-     nothing unusual Psych:  change in mood or affect.  depression or anxiety.   memory loss.  OBJ- Physical Exam General- Alert, Oriented, Affect-appropriate, Distress- none acute, +tall Skin- rash-none, lesions- none, excoriation- none Lymphadenopathy- none Head- atraumatic            Eyes- Gross vision intact, PERRLA, conjunctivae and secretions clear            Ears- Hearing, canals-normal            Nose- Clear, no-Septal dev, mucus, polyps, erosion, perforation             Throat- Mallampati III-IV , mucosa clear , drainage- none, tonsils- atrophic, + teeth Neck- flexible , trachea midline, no stridor , thyroid nl, carotid no bruit Chest - symmetrical excursion , unlabored           Heart/CV-  RRR , no murmur , no gallop  , no rub, nl s1 s2                           - JVD- none , edema- none, stasis changes- none, varices- none           Lung- clear to P&A, wheeze- none, cough- none , dullness-none, rub- none           Chest wall-  Abd-  Br/ Gen/ Rectal- Not done, not indicated Extrem- cyanosis- none, clubbing, none, atrophy- none, strength- nl Neuro- grossly intact to observation

## 2021-08-07 ENCOUNTER — Ambulatory Visit: Payer: BC Managed Care – PPO | Admitting: Internal Medicine

## 2021-08-07 ENCOUNTER — Encounter: Payer: Self-pay | Admitting: Internal Medicine

## 2021-08-07 NOTE — Assessment & Plan Note (Signed)
Reassured comfort issues with CPAP

## 2021-08-07 NOTE — Assessment & Plan Note (Signed)
Should beneft from CPAP. Comfort and goals discussed Plan- [facility] change auto range to 4-15. Send for mask fitting.

## 2021-08-09 DIAGNOSIS — G4733 Obstructive sleep apnea (adult) (pediatric): Secondary | ICD-10-CM | POA: Diagnosis not present

## 2021-08-12 ENCOUNTER — Emergency Department (HOSPITAL_BASED_OUTPATIENT_CLINIC_OR_DEPARTMENT_OTHER): Payer: BC Managed Care – PPO

## 2021-08-12 ENCOUNTER — Other Ambulatory Visit (HOSPITAL_BASED_OUTPATIENT_CLINIC_OR_DEPARTMENT_OTHER): Payer: Self-pay

## 2021-08-12 ENCOUNTER — Encounter (HOSPITAL_BASED_OUTPATIENT_CLINIC_OR_DEPARTMENT_OTHER): Payer: Self-pay

## 2021-08-12 ENCOUNTER — Other Ambulatory Visit: Payer: Self-pay

## 2021-08-12 ENCOUNTER — Emergency Department (HOSPITAL_BASED_OUTPATIENT_CLINIC_OR_DEPARTMENT_OTHER)
Admission: EM | Admit: 2021-08-12 | Discharge: 2021-08-12 | Disposition: A | Discharge status: Home / Self Care | Payer: BC Managed Care – PPO | Attending: Emergency Medicine | Admitting: Emergency Medicine

## 2021-08-12 DIAGNOSIS — M542 Cervicalgia: Secondary | ICD-10-CM | POA: Diagnosis not present

## 2021-08-12 DIAGNOSIS — M47892 Other spondylosis, cervical region: Secondary | ICD-10-CM | POA: Diagnosis not present

## 2021-08-12 DIAGNOSIS — S199XXA Unspecified injury of neck, initial encounter: Secondary | ICD-10-CM | POA: Diagnosis not present

## 2021-08-12 DIAGNOSIS — M25511 Pain in right shoulder: Secondary | ICD-10-CM | POA: Diagnosis not present

## 2021-08-12 DIAGNOSIS — M47812 Spondylosis without myelopathy or radiculopathy, cervical region: Secondary | ICD-10-CM | POA: Diagnosis not present

## 2021-08-12 DIAGNOSIS — M25512 Pain in left shoulder: Secondary | ICD-10-CM | POA: Diagnosis not present

## 2021-08-12 MED ORDER — METHOCARBAMOL 500 MG PO TABS
500.0000 mg | ORAL_TABLET | Freq: Two times a day (BID) | ORAL | 0 refills | Status: DC
  Filled 2021-08-12: qty 20, 10d supply, fill #0

## 2021-08-12 MED ORDER — LIDOCAINE 5 % EX PTCH
1.0000 | MEDICATED_PATCH | CUTANEOUS | 0 refills | Status: DC
  Filled 2021-08-12: qty 30, 30d supply, fill #0

## 2021-08-12 MED ORDER — NAPROXEN 250 MG PO TABS
500.0000 mg | ORAL_TABLET | Freq: Two times a day (BID) | ORAL | 0 refills | Status: AC
  Filled 2021-08-12: qty 56, 14d supply, fill #0
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
## 2021-08-12 NOTE — Discharge Instructions (Addendum)
There are no fractures in your spine today.  It does appear that you have some degenerative changes in the vertebrae of your spine.  These are between C4 and C6 and could be explaining your numbness and tingling.  I am attaching a sports medicine office for you to follow-up with if you continue to have symptoms and would like to be seen further.  You may also speak with your primary care provider about this.  I have sent some muscle relaxants, naproxen and lidocaine patches to your pharmacy.  The important thing to remember is that you may utilize up to 3 lidocaine patches at the same time however you must have 12 hours each day without any patches.  It was a pleasure to meet you today, happy birthday and I hope that you feel better.

## 2021-09-09 DIAGNOSIS — G4733 Obstructive sleep apnea (adult) (pediatric): Secondary | ICD-10-CM | POA: Diagnosis not present

## 2021-10-09 DIAGNOSIS — G4733 Obstructive sleep apnea (adult) (pediatric): Secondary | ICD-10-CM | POA: Diagnosis not present

## 2021-11-04 ENCOUNTER — Other Ambulatory Visit: Payer: Self-pay

## 2021-11-04 ENCOUNTER — Emergency Department
Admission: EM | Admit: 2021-11-04 | Discharge: 2021-11-04 | Disposition: A | Discharge status: Home / Self Care | Payer: BC Managed Care – PPO | Source: Home / Self Care

## 2021-11-04 DIAGNOSIS — J029 Acute pharyngitis, unspecified: Secondary | ICD-10-CM | POA: Diagnosis not present

## 2021-11-04 DIAGNOSIS — J111 Influenza due to unidentified influenza virus with other respiratory manifestations: Secondary | ICD-10-CM | POA: Diagnosis not present

## 2021-11-04 LAB — POC SARS CORONAVIRUS 2 AG -  ED: SARS Coronavirus 2 Ag: NEGATIVE

## 2021-11-04 LAB — POCT INFLUENZA A/B
Influenza A, POC: NEGATIVE
Influenza B, POC: NEGATIVE

## 2021-11-04 MED ORDER — OSELTAMIVIR PHOSPHATE 75 MG PO CAPS: 75.0000 mg | ORAL_CAPSULE | Freq: Two times a day (BID) | ORAL | 0 refills | Status: DC

## 2021-11-04 MED ORDER — AZITHROMYCIN 250 MG PO TABS: 250.0000 mg | ORAL_TABLET | Freq: Every day | ORAL | 0 refills | Status: DC

## 2021-11-04 NOTE — ED Provider Notes (Signed)
[name]    CSN: [id number] Arrival date & time: 11/04/21  1011      History   Chief Complaint Chief Complaint  Patient presents with   Fever    Fever, chills, sore throat, and headache. X2 days    HPI Aaron [name] is a 46 y.o. male.   HPI 46 year old male presents with fever, chills, sore throat and headache x 2 days.  Patient is accompanied by his wife today at this office visit.  Past Medical History:  Diagnosis Date   Allergic rhinitis    Allergy    Anxiety    Chicken pox    Depression     Patient Active Problem List   Diagnosis Date Noted   OSA (obstructive sleep apnea) 02/13/2021   Seasonal and perennial allergic rhinitis    Generalized anxiety disorder 08/03/2018   Recurrent major depression in partial remission (HCC) 01/08/2018    Past Surgical History:  Procedure Laterality Date   DENTAL SURGERY         Home Medications    Prior to Admission medications   Medication Sig Start Date End Date Taking? Authorizing Provider  azithromycin (ZITHROMAX) 250 MG tablet Take 1 tablet (250 mg total) by mouth daily. Take first 2 tablets together, then 1 every day until finished. 11/04/21  Yes Trevor Ihaagan, Hasini Peachey, FNP  oseltamivir (TAMIFLU) 75 MG capsule Take 1 capsule (75 mg total) by mouth every 12 (twelve) hours. 11/04/21  Yes Trevor Ihaagan, Tanaiya Kolarik, FNP  cetirizine (ZYRTEC) 10 MG tablet Take 10 mg by mouth daily.    [provider]  lidocaine (LIDODERM) 5 % Place 1 patch onto the skin daily. Remove & Discard patch within 12 hours or as directed by MD 08/12/21   Redwine, Madison A, PA-C  methocarbamol (ROBAXIN) 500 MG tablet Take 1 tablet (500 mg total) by mouth 2 (two) times daily. 08/12/21   Redwine, Madison A, PA-C    Family History Family History  Problem Relation Age of Onset   Heart disease Father    Depression Maternal Grandmother        commited suicide    Social History Social History   Tobacco Use   Smoking status: Never    Smokeless tobacco: Never  Vaping Use   Vaping Use: Former  Substance Use Topics   Alcohol use: Yes    Alcohol/week: 12.0 standard drinks    Types: 12 Cans of beer per week    Comment: some increase with increased anxiety   Drug use: No     Allergies   Other   Review of Systems Review of Systems  Constitutional:  Positive for chills and fever.  HENT:  Positive for sore throat.   Musculoskeletal:  Positive for myalgias.  Neurological:  Positive for headaches.  All other systems reviewed and are negative.   Physical Exam Triage Vital Signs ED Triage Vitals  Enc Vitals Group     BP 11/04/21 1030 126/86     Pulse Rate 11/04/21 1030 (!) 125     Resp 11/04/21 1030 20     Temp 11/04/21 1030 (!) 102.6 F (39.2 C)     Temp Source 11/04/21 1030 Oral     SpO2 11/04/21 1030 96 %     Weight 11/04/21 1027 243 lb (110.2 kg)     Height 11/04/21 1027 6\' 3"  (1.905 m)     Head Circumference --      Peak Flow --      Pain Score 11/04/21 1027 8  Pain Loc --      Pain Edu? --      Excl. in GC? --    No data found.  Updated Vital Signs BP 126/86 (BP Location: Left Arm)    Pulse (!) 125    Temp (!) 102.6 F (39.2 C) (Oral)    Resp 20    Ht 6\' 3"  (1.905 m)    Wt 243 lb (110.2 kg)    SpO2 96%    BMI 30.37 kg/m       Physical Exam Vitals and nursing note reviewed.  Constitutional:      Appearance: Normal appearance. He is normal weight.  HENT:     Head: Normocephalic and atraumatic.     Right Ear: Tympanic membrane, ear canal and external ear normal.     Left Ear: Tympanic membrane, ear canal and external ear normal.     Mouth/Throat:     Mouth: Mucous membranes are moist.     Pharynx: Oropharynx is clear.  Eyes:     Extraocular Movements: Extraocular movements intact.     Conjunctiva/sclera: Conjunctivae normal.     Pupils: Pupils are equal, round, and reactive to light.  Cardiovascular:     Rate and Rhythm: Normal rate and regular rhythm.     Pulses: Normal pulses.      Heart sounds: Normal heart sounds.  Pulmonary:     Effort: Pulmonary effort is normal.     Breath sounds: Normal breath sounds.  Musculoskeletal:     Cervical back: Normal range of motion and neck supple.  Skin:    General: Skin is warm and dry.  Neurological:     General: No focal deficit present.     Mental Status: He is alert and oriented to person, place, and time.     UC Treatments / Results  Labs (all labs ordered are listed, but only abnormal results are displayed) Labs Reviewed  POCT INFLUENZA A/B - Normal  POC SARS CORONAVIRUS 2 AG -  ED - Normal    EKG   Radiology No results found.  Procedures Procedures (including critical care time)  Medications Ordered in UC Medications - No data to display  Initial Impression / Assessment and Plan / UC Course  I have reviewed the triage vital signs and the nursing notes.  Pertinent labs & imaging results that were available during my care of the patient were reviewed by me and considered in my medical decision making (see chart for details).     MDM: 1.  Influenza-like illness-Rx'd Tamiflu; 2.  Acute pharyngitis-Rx'd Zithromax.  Work note provided prior to discharge per patient request. Advised patient to take medications as directed with food to completion.  Encouraged patient increase daily water intake while taking these medications.  Patient discharged home, hemodynamically stable. Final Clinical Impressions(s) / UC Diagnoses   Final diagnoses:  Influenza-like illness  Acute pharyngitis, unspecified etiology     Discharge Instructions      Advised patient to take medications as directed with food to completion.  Encouraged patient increase daily water intake while taking these medications.     ED Prescriptions     Medication Sig Dispense Auth. Provider   oseltamivir (TAMIFLU) 75 MG capsule Take 1 capsule (75 mg total) by mouth every 12 (twelve) hours. 10 capsule , FNP   azithromycin  (ZITHROMAX) 250 MG tablet Take 1 tablet (250 mg total) by mouth daily. Take first 2 tablets together, then 1 every day until finished. 6 tablet Brendy Ficek,  Casimiro Needle, FNP      PDMP not reviewed this encounter.   Trevor Iha, FNP 11/04/21 1150

## 2021-11-04 NOTE — Discharge Instructions (Addendum)
Advised patient to take medications as directed with food to completion.  Encouraged patient increase daily water intake while taking these medications.

## 2021-11-04 NOTE — ED Triage Notes (Signed)
Pt states that he has a fever, chills, body aches, sore throat and headache. X2 days  Pt states that he had a negative at home covid test.  Pt states that he has been vaccinated for covid. Pt states that he hasn't had flu vaccine.  Pt states that daughter had covid 5 days ago.

## 2022-01-30 ENCOUNTER — Ambulatory Visit (INDEPENDENT_AMBULATORY_CARE_PROVIDER_SITE_OTHER): Payer: BC Managed Care – PPO | Admitting: Family Medicine

## 2022-01-30 ENCOUNTER — Ambulatory Visit: Payer: Self-pay

## 2022-01-30 ENCOUNTER — Ambulatory Visit: Payer: BC Managed Care – PPO | Admitting: Family

## 2022-01-30 ENCOUNTER — Ambulatory Visit (INDEPENDENT_AMBULATORY_CARE_PROVIDER_SITE_OTHER): Payer: BC Managed Care – PPO

## 2022-01-30 VITALS — BP 162/104 | HR 81 | Ht 75.0 in | Wt 238.8 lb

## 2022-01-30 DIAGNOSIS — M25572 Pain in left ankle and joints of left foot: Secondary | ICD-10-CM | POA: Diagnosis not present

## 2022-01-30 DIAGNOSIS — G8929 Other chronic pain: Secondary | ICD-10-CM

## 2022-01-30 DIAGNOSIS — M25562 Pain in left knee: Secondary | ICD-10-CM

## 2022-01-30 NOTE — Progress Notes (Signed)
? ?I, [name], LAT, ATC acting as a scribe for Aaron Leader, MD. ? ?Subjective:   ? ?CC: L knee and L ankle pain ? ?HPI: Pt is a 46 y/o male c/o L knee and L ankle pain. ? ?L knee pain x 2 months. MOI: Pt was building a shed and was bent over and knee got "stuck" and he had to force it to extend. Pt heard a loud "cracking sound." Pt locates pain to anterior and medial aspect. ? ?L knee swelling: no ?Mechanical symptoms: yes ?Aggravates: squatting, walking, steps ?Treatments tried: ice, IBU, stretching ? ?Pt also c/o L ankle pain ongoing since 4/17. MOI: Pt was playing [employer], jumped to catch a ball and landed rolling his ankle in INV.  Pt locates pain to across the anterior aspect of the ankle/talocrural joint and along the lateral aspect.  ? ?L ankle swelling: yes- w/ ecchymosis ?Treatments tried: RICE ? ?Dx imaging: 12/10/16 L-spine XR ? ?Pertinent review of Systems: No fevers or chills ? ?Relevant historical information: Sleep apnea ? ? ?Objective:   ? ?Vitals:  ? 01/30/22 1300  ?BP: (!) 162/104  ?Pulse: 81  ?SpO2: 98%  ? ?General: Well Developed, well nourished, and in no acute distress.  ? ?MSK: Left knee no significant joint effusion. ?Normal motion with mild crepitation. ?Nontender. ?Mildly positive medial McMurray's test. ?Stable ligamentous exam. ?Intact strength. ? ?Left ankle: Moderate effusion with some bruising laterally. ?Normal ankle motion. ?Laxity to talar tilt and anterior drawer test. ?Intact strength. ?Tender palpation mildly at ATFL region. ? ?Lab and Radiology Results ? ?Procedure: Real-time Ultrasound Guided Injection of left knee superior lateral patellar space ?Device: Philips Affiniti 50G ?Images permanently stored and available for review in PACS ?Ultrasound evaluation prior to injection reveals partially extruded medial meniscus with some small parameniscal cysts indicating likely degenerative meniscus tear. ?Verbal informed consent obtained.  Discussed risks and benefits of  procedure. Warned about infection, bleeding, hyperglycemia damage to structures among others. ?Patient expresses understanding and agreement ?Time-out conducted.   ?Noted no overlying erythema, induration, or other signs of local infection.   ?Skin prepped in a sterile fashion.   ?Local anesthesia: Topical Ethyl chloride.   ?With sterile technique and under real time ultrasound guidance: 40 mg of Kenalog and 2 mL of Marcaine injected into knee joint. Fluid seen entering the joint capsule.   ?Completed without difficulty   ?Pain immediately resolved suggesting accurate placement of the medication.   ?Advised to call if fevers/chills, erythema, induration, drainage, or persistent bleeding.   ?Images permanently stored and available for review in the ultrasound unit.  ?Impression: Technically successful ultrasound guided injection. ? ? ? ? ? ?X-ray images left knee and ankle obtained today personally and independently interpreted ? ? ?Left ankle: No acute fractures are visible. ?Small rounded lucency present at anterior ankle seen on lateral view consistent with old avulsion fracture possibly. ? ?Await formal radiology review ? ?Impression and Recommendations:   ? ?Assessment and Plan: ?46 y.o. male with left knee pain chronically with mechanical symptoms.  This is concerning for a medial meniscus tear.  Clinically he is doing okay so we will try some conservative management strategies including Voltaren gel and steroid injection.  If not improved recommend x-ray and potential MRI for surgical planning. ? ?He also has an acute lateral ankle sprain.  Plan for bracing compression and home exercise program taught in clinic today by ATC. ? ?Recheck in about a month.. ? ?PDMP not reviewed this encounter. ?Orders Placed This  Encounter  ?Procedures  ? [place] LIMITED JOINT SPACE STRUCTURES LOW LEFT(NO LINKED CHARGES)  ?  Order Specific Question:   Reason for Exam (SYMPTOM  OR DIAGNOSIS REQUIRED)  ?  Answer:   left knee pain  ?   Order Specific Question:   Preferred imaging location?  ?  Answer:   [facility]  ? DG Ankle Complete Left  ?  Standing Status:   Future  ?  Number of Occurrences:   1  ?  Standing Expiration Date:   01/31/2023  ?  Order Specific Question:   Reason for Exam (SYMPTOM  OR DIAGNOSIS REQUIRED)  ?  Answer:   left ankle pain  ?  Order Specific Question:   Preferred imaging location?  ?  Answer:   [facility]  ? ?No orders of the defined types were placed in this encounter. ? ? ?Discussed warning signs or symptoms. Please see discharge instructions. Patient expresses understanding. ? ? ?The above documentation has been reviewed and is accurate and complete [name], M.D. ? ?

## 2022-01-30 NOTE — Patient Instructions (Addendum)
Thank you for coming in today.  ? ?Please get an Xray today before you leave  ? ?You received a steroid injection in your left knee today. Seek immediate medical attention if the joint becomes red, extremely painful, or is oozing fluid.  ? ?Please complete the exercises that the athletic trainer went over with you:  View at [email or web address] using code: [id number] ? ?Recheck back in 1 month ?

## 2022-02-03 NOTE — Progress Notes (Signed)
Left ankle x-ray shows mild arthritis

## 2022-02-26 NOTE — Progress Notes (Deleted)
   I, Christoper Fabian, LAT, ATC, am serving as scribe for Dr. Clementeen Graham.  Raymond Bhardwaj is a 46 y.o. male who presents to [facility] today for f/u of L knee pain possibly due to meniscal pathology and L ankle pain due to an ankle sprain.  He was last seen by Dr. Denyse Amass on 01/30/22 and had a L knee steroid injection.  He was also shown a HEP for ankle strengthening and advised to wear an ankle brace.  Today, pt reports   Diagnostic testing: L ankle XR- 01/30/22  Pertinent review of systems: ***  Relevant historical information: ***   Exam:  There were no vitals taken for this visit. General: Well Developed, well nourished, and in no acute distress.   MSK: ***    Lab and Radiology Results No results found for this or any previous visit (from the past 72 hour(s)). No results found.     Assessment and Plan: 46 y.o. male with ***   PDMP not reviewed this encounter. No orders of the defined types were placed in this encounter.  No orders of the defined types were placed in this encounter.    Discussed warning signs or symptoms. Please see discharge instructions. Patient expresses understanding.   ***

## 2022-02-27 ENCOUNTER — Ambulatory Visit: Payer: BC Managed Care – PPO | Admitting: Family Medicine

## 2022-02-28 ENCOUNTER — Encounter: Payer: Self-pay | Admitting: *Deleted

## 2022-04-23 ENCOUNTER — Telehealth: Payer: Self-pay | Admitting: Adult Health

## 2022-04-23 NOTE — Telephone Encounter (Signed)
ERROR. Please disregard

## 2022-04-24 ENCOUNTER — Ambulatory Visit: Payer: BC Managed Care – PPO | Admitting: Mental Health

## 2022-05-08 ENCOUNTER — Encounter: Payer: Self-pay | Admitting: Adult Health

## 2022-05-08 NOTE — Telephone Encounter (Signed)
Please advise 

## 2022-08-13 ENCOUNTER — Encounter: Payer: Self-pay | Admitting: Family Medicine

## 2022-08-13 ENCOUNTER — Ambulatory Visit: Payer: BC Managed Care – PPO | Admitting: Family Medicine

## 2022-08-13 VITALS — BP 128/80 | HR 67 | Temp 98.7°F | Ht 75.0 in | Wt 245.0 lb

## 2022-08-13 DIAGNOSIS — S61219D Laceration without foreign body of unspecified finger without damage to nail, subsequent encounter: Secondary | ICD-10-CM

## 2022-08-13 NOTE — Progress Notes (Signed)
   Subjective:    Patient ID: [name], male    DOB: 08-21-1976, 46 y.o.   MRN: [id number]  HPI Here to follow up on an urgent care visit on 07-31-22 after he lacerated the left index finger. The wound was closed with 7 sutures, and he was given 5 days of Cefadroxil. He says the area is still a little tender, and he is still somewhat numb at the tip.    Review of Systems  Constitutional: Negative.   Respiratory: Negative.    Cardiovascular: Negative.   Skin:  Positive for wound.       Objective:   Physical Exam Constitutional:      Appearance: Normal appearance.  Cardiovascular:     Rate and Rhythm: Normal rate and regular rhythm.     Pulses: Normal pulses.     Heart sounds: Normal heart sounds.  Pulmonary:     Effort: Pulmonary effort is normal.     Breath sounds: Normal breath sounds.  Skin:    Comments: There is a "V" shaped laceration on the radial side of the left index finger. The wound is closed and looks clean   Neurological:     Mental Status: He is alert.           Assessment & Plan:  Finger laceration. All sutures were removed. Recheck as needed.  Alysia Penna, MD

## 2022-11-04 IMAGING — DX DG ANKLE COMPLETE 3+V*L*
3 series · 3 of 3 positions shown · non-contrast
Comparison: None.

CLINICAL DATA: Left ankle pain after injury playing lacrosse 3 days
ago.

EXAM:
LEFT ANKLE COMPLETE - 3+ VIEW

[ankle ap]
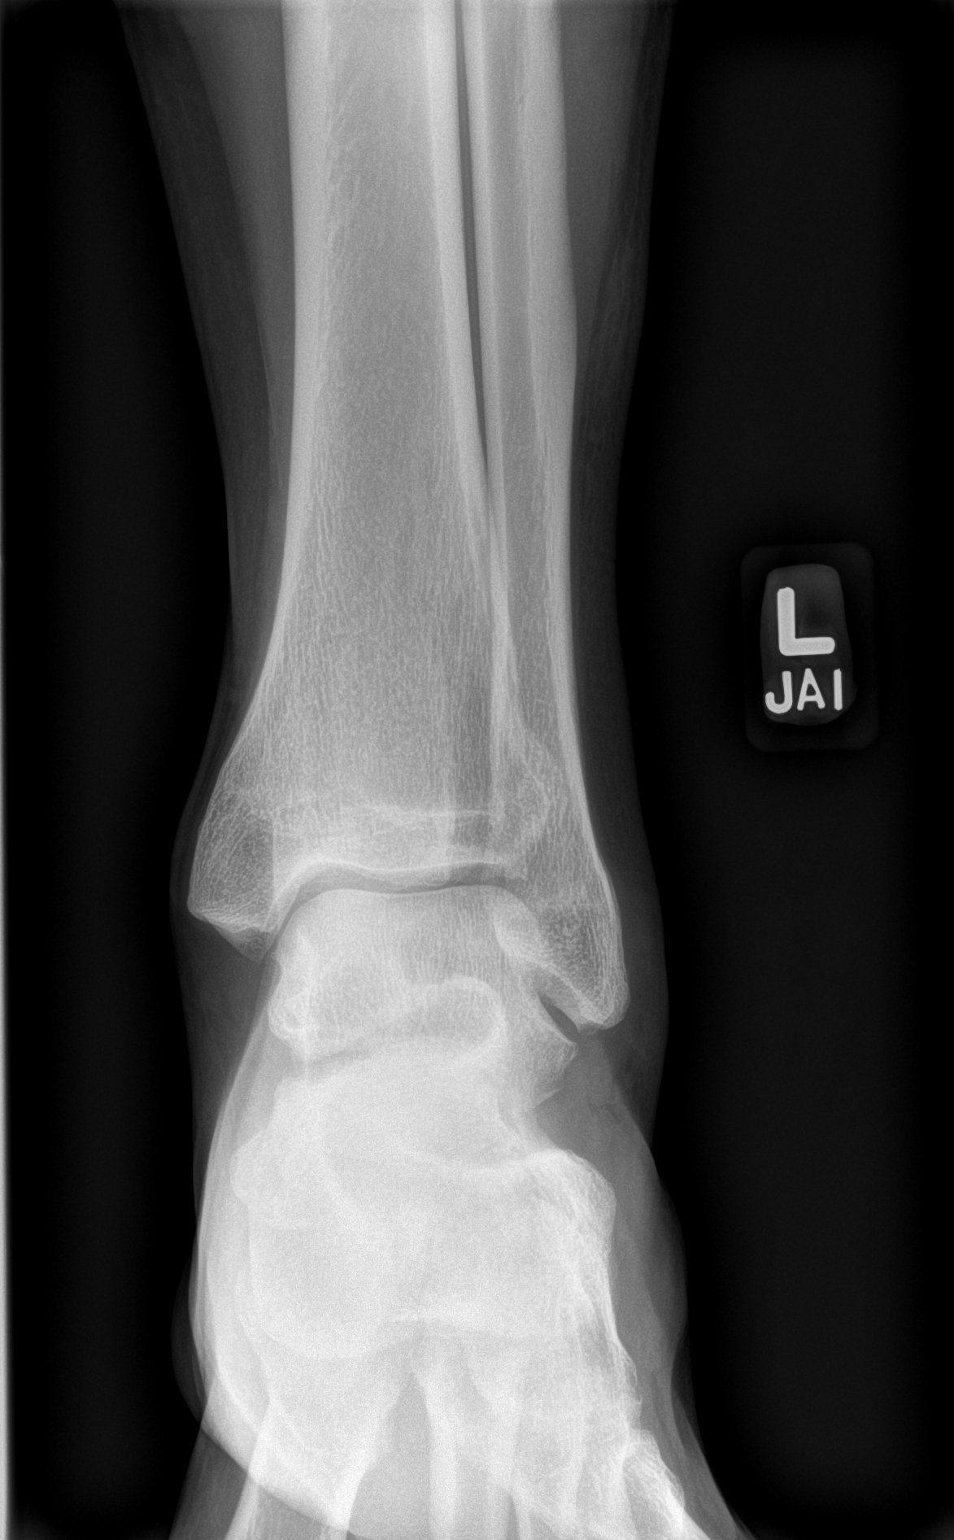

[ankle obl]
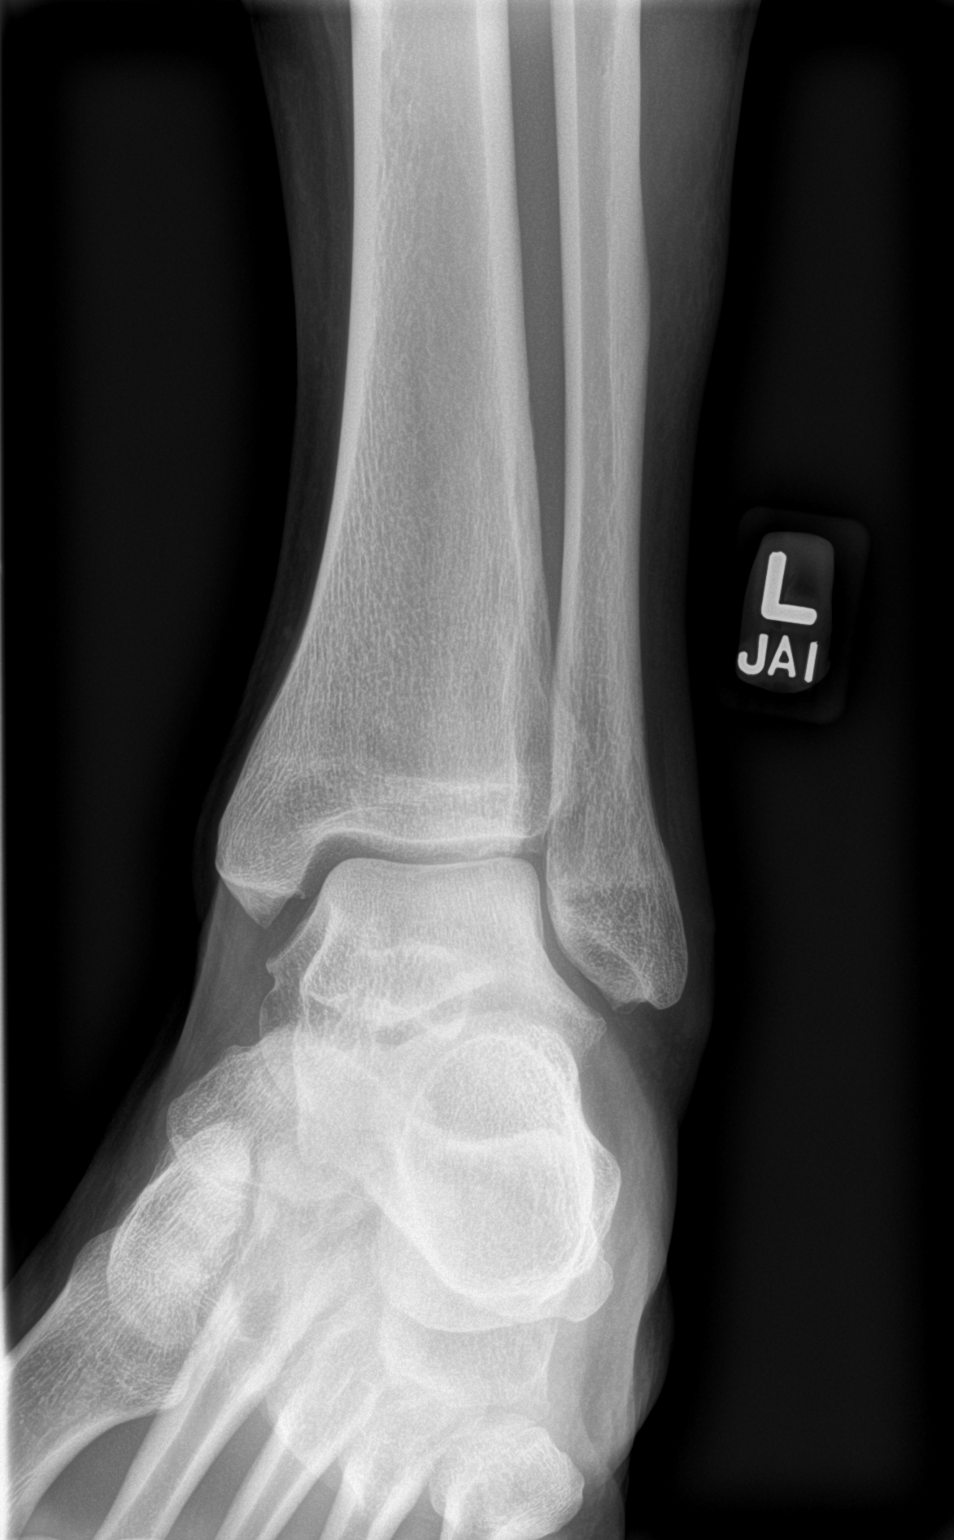

[ankle lat]
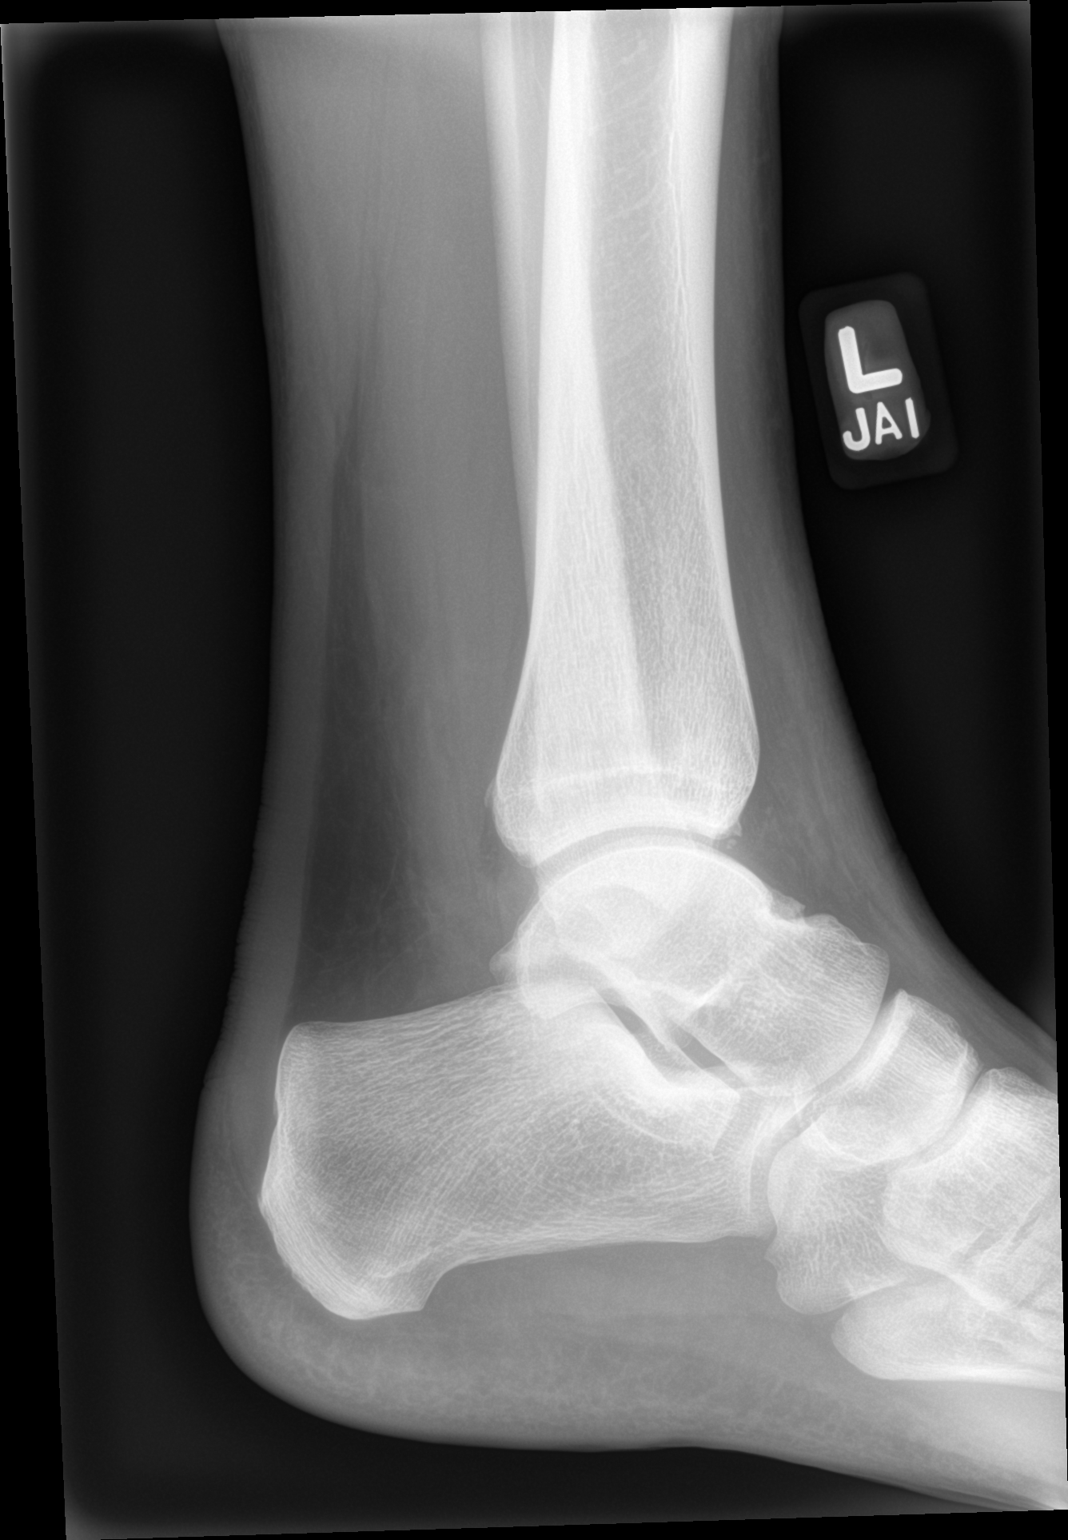

[3 of 3 positions shown; findings below may reference images not displayed]

FINDINGS: The ankle mortise is symmetric and intact. Mild distal fibula and
adjacent lateral talar process degenerative osteophytes. Mild dorsal
talar neck degenerative osteophytosis. Minimal distal anterior
tibial plafond degenerative spurring. No acute fracture is seen. No
dislocation.
IMPRESSION: Minimal osteoarthritis.  No acute fracture.

## 2023-11-02 ENCOUNTER — Telehealth: Payer: Self-pay | Admitting: Adult Health

## 2023-11-02 NOTE — Telephone Encounter (Signed)
Lmom for pt to sch cpe or office visit it has been over a year since last seen cory

## 2024-05-10 NOTE — Procedures (Signed)
Mask fit
